# Patient Record
Sex: Male | Born: 1970 | Race: White | Hispanic: No | Marital: Married | Smoking: Former smoker
Health system: Southern US, Community
[De-identification: ages and names within clinical notes are randomized; demographics above are authoritative.]

## PROBLEM LIST (undated history)

## (undated) DIAGNOSIS — Z5189 Encounter for other specified aftercare: Secondary | ICD-10-CM

## (undated) DIAGNOSIS — Z1379 Encounter for other screening for genetic and chromosomal anomalies: Secondary | ICD-10-CM

## (undated) DIAGNOSIS — C189 Malignant neoplasm of colon, unspecified: Secondary | ICD-10-CM

## (undated) HISTORY — DX: Encounter for other screening for genetic and chromosomal anomalies: Z13.79

## (undated) HISTORY — PX: TOOTH EXTRACTION: SUR596

## (undated) HISTORY — DX: Encounter for other specified aftercare: Z51.89

## (undated) HISTORY — DX: Malignant neoplasm of colon, unspecified: C18.9

---

## 1999-11-30 ENCOUNTER — Encounter: Payer: Self-pay | Admitting: Emergency Medicine

## 1999-11-30 ENCOUNTER — Emergency Department (HOSPITAL_COMMUNITY): Admission: EM | Admit: 1999-11-30 | Discharge: 1999-11-30 | Payer: Self-pay | Admitting: Emergency Medicine

## 2000-02-25 ENCOUNTER — Emergency Department (HOSPITAL_COMMUNITY): Admission: EM | Admit: 2000-02-25 | Discharge: 2000-02-25 | Payer: Self-pay

## 2004-04-23 DIAGNOSIS — C189 Malignant neoplasm of colon, unspecified: Secondary | ICD-10-CM

## 2004-04-23 HISTORY — DX: Malignant neoplasm of colon, unspecified: C18.9

## 2005-02-21 HISTORY — PX: COLON SURGERY: SHX602

## 2005-03-02 ENCOUNTER — Ambulatory Visit: Payer: Self-pay | Admitting: General Surgery

## 2005-03-06 ENCOUNTER — Ambulatory Visit: Payer: Self-pay | Admitting: Oncology

## 2005-03-12 ENCOUNTER — Ambulatory Visit: Payer: Self-pay | Admitting: General Surgery

## 2005-03-14 ENCOUNTER — Inpatient Hospital Stay: Payer: Self-pay | Admitting: General Surgery

## 2005-03-23 ENCOUNTER — Ambulatory Visit: Payer: Self-pay | Admitting: Oncology

## 2005-03-28 ENCOUNTER — Ambulatory Visit: Payer: Self-pay | Admitting: General Surgery

## 2005-04-06 ENCOUNTER — Observation Stay: Payer: Self-pay | Admitting: Internal Medicine

## 2005-04-22 ENCOUNTER — Emergency Department: Payer: Self-pay | Admitting: Emergency Medicine

## 2005-04-23 ENCOUNTER — Ambulatory Visit: Payer: Self-pay | Admitting: Oncology

## 2005-04-23 DIAGNOSIS — Z1379 Encounter for other screening for genetic and chromosomal anomalies: Secondary | ICD-10-CM

## 2005-04-23 HISTORY — DX: Encounter for other screening for genetic and chromosomal anomalies: Z13.79

## 2005-05-24 ENCOUNTER — Ambulatory Visit: Payer: Self-pay | Admitting: Oncology

## 2005-06-21 ENCOUNTER — Ambulatory Visit: Payer: Self-pay | Admitting: Oncology

## 2005-07-22 ENCOUNTER — Ambulatory Visit: Payer: Self-pay | Admitting: Oncology

## 2005-08-21 ENCOUNTER — Ambulatory Visit: Payer: Self-pay | Admitting: Oncology

## 2005-09-21 ENCOUNTER — Ambulatory Visit: Payer: Self-pay | Admitting: Oncology

## 2005-10-13 ENCOUNTER — Emergency Department: Payer: Self-pay | Admitting: General Practice

## 2005-11-27 ENCOUNTER — Ambulatory Visit: Payer: Self-pay | Admitting: Oncology

## 2005-12-22 ENCOUNTER — Ambulatory Visit: Payer: Self-pay | Admitting: Oncology

## 2006-03-04 ENCOUNTER — Ambulatory Visit: Payer: Self-pay | Admitting: General Surgery

## 2006-03-07 ENCOUNTER — Ambulatory Visit: Payer: Self-pay | Admitting: Oncology

## 2006-03-23 ENCOUNTER — Ambulatory Visit: Payer: Self-pay | Admitting: Oncology

## 2006-06-05 ENCOUNTER — Ambulatory Visit: Payer: Self-pay | Admitting: Oncology

## 2006-06-22 ENCOUNTER — Ambulatory Visit: Payer: Self-pay | Admitting: Oncology

## 2006-10-02 ENCOUNTER — Ambulatory Visit: Payer: Self-pay | Admitting: Oncology

## 2006-10-22 ENCOUNTER — Ambulatory Visit: Payer: Self-pay | Admitting: Oncology

## 2006-12-23 ENCOUNTER — Ambulatory Visit: Payer: Self-pay | Admitting: Oncology

## 2007-01-16 ENCOUNTER — Ambulatory Visit: Payer: Self-pay | Admitting: Oncology

## 2007-01-22 ENCOUNTER — Ambulatory Visit: Payer: Self-pay | Admitting: Oncology

## 2007-04-24 ENCOUNTER — Ambulatory Visit: Payer: Self-pay | Admitting: Oncology

## 2007-05-01 ENCOUNTER — Ambulatory Visit: Payer: Self-pay | Admitting: Oncology

## 2007-05-25 ENCOUNTER — Ambulatory Visit: Payer: Self-pay | Admitting: Oncology

## 2007-08-22 ENCOUNTER — Ambulatory Visit: Payer: Self-pay | Admitting: Oncology

## 2007-09-18 ENCOUNTER — Ambulatory Visit: Payer: Self-pay | Admitting: Oncology

## 2007-09-22 ENCOUNTER — Ambulatory Visit: Payer: Self-pay | Admitting: Oncology

## 2007-12-23 ENCOUNTER — Ambulatory Visit: Payer: Self-pay | Admitting: Oncology

## 2008-01-15 ENCOUNTER — Ambulatory Visit: Payer: Self-pay | Admitting: Oncology

## 2008-01-22 ENCOUNTER — Ambulatory Visit: Payer: Self-pay | Admitting: Oncology

## 2008-07-22 ENCOUNTER — Ambulatory Visit: Payer: Self-pay | Admitting: Oncology

## 2008-07-29 ENCOUNTER — Ambulatory Visit: Payer: Self-pay | Admitting: Oncology

## 2008-08-21 ENCOUNTER — Ambulatory Visit: Payer: Self-pay | Admitting: Oncology

## 2009-01-21 ENCOUNTER — Ambulatory Visit: Payer: Self-pay | Admitting: Oncology

## 2009-01-27 ENCOUNTER — Ambulatory Visit: Payer: Self-pay | Admitting: Oncology

## 2009-02-21 ENCOUNTER — Ambulatory Visit: Payer: Self-pay | Admitting: Oncology

## 2009-04-05 ENCOUNTER — Ambulatory Visit: Payer: Self-pay | Admitting: General Surgery

## 2009-07-22 ENCOUNTER — Ambulatory Visit: Payer: Self-pay | Admitting: Oncology

## 2009-07-28 ENCOUNTER — Ambulatory Visit: Payer: Self-pay | Admitting: Oncology

## 2009-08-21 ENCOUNTER — Ambulatory Visit: Payer: Self-pay | Admitting: Oncology

## 2010-08-21 ENCOUNTER — Ambulatory Visit: Payer: Self-pay | Admitting: Oncology

## 2010-08-22 ENCOUNTER — Ambulatory Visit: Payer: Self-pay | Admitting: Oncology

## 2010-08-22 LAB — CEA: CEA: 2.2 ng/mL (ref 0.0–4.7)

## 2011-09-13 ENCOUNTER — Ambulatory Visit: Payer: Self-pay | Admitting: Oncology

## 2011-09-13 LAB — COMPREHENSIVE METABOLIC PANEL
Albumin: 4.5 g/dL (ref 3.4–5.0)
Alkaline Phosphatase: 55 U/L (ref 50–136)
Anion Gap: 11 (ref 7–16)
BUN: 12 mg/dL (ref 7–18)
Bilirubin,Total: 1 mg/dL (ref 0.2–1.0)
Calcium, Total: 8.8 mg/dL (ref 8.5–10.1)
Chloride: 102 mmol/L (ref 98–107)
Co2: 28 mmol/L (ref 21–32)
Creatinine: 1.05 mg/dL (ref 0.60–1.30)
EGFR (African American): >60
EGFR (Non-African Amer.): >60
Glucose: 129 mg/dL — ABNORMAL HIGH (ref 65–99)
Osmolality: 283 (ref 275–301)
Potassium: 3.7 mmol/L (ref 3.5–5.1)
SGOT(AST): 22 U/L (ref 15–37)
SGPT (ALT): 31 U/L
Sodium: 141 mmol/L (ref 136–145)
Total Protein: 7.9 g/dL (ref 6.4–8.2)

## 2011-09-13 LAB — CBC CANCER CENTER
Basophil #: 0 x10 3/mm (ref 0.0–0.1)
Basophil %: 0.6 %
Eosinophil #: 0 x10 3/mm (ref 0.0–0.7)
Eosinophil %: 0.4 %
HCT: 42.9 % (ref 40.0–52.0)
HGB: 14.4 g/dL (ref 13.0–18.0)
Lymphocyte #: 1.4 x10 3/mm (ref 1.0–3.6)
Lymphocyte %: 25.7 %
MCH: 30 pg (ref 26.0–34.0)
MCHC: 33.5 g/dL (ref 32.0–36.0)
MCV: 90 fL (ref 80–100)
Monocyte #: 0.4 x10 3/mm (ref 0.2–1.0)
Monocyte %: 8 %
Neutrophil #: 3.6 x10 3/mm (ref 1.4–6.5)
Neutrophil %: 65.3 %
Platelet: 171 x10 3/mm (ref 150–440)
RBC: 4.79 10*6/uL (ref 4.40–5.90)
RDW: 14.1 % (ref 11.5–14.5)
WBC: 5.4 x10 3/mm (ref 3.8–10.6)

## 2011-09-14 LAB — CEA: CEA: 2.6 ng/mL (ref 0.0–4.7)

## 2011-09-22 ENCOUNTER — Ambulatory Visit: Payer: Self-pay | Admitting: Oncology

## 2012-05-28 ENCOUNTER — Ambulatory Visit: Payer: Self-pay | Admitting: General Surgery

## 2012-05-28 HISTORY — PX: COLONOSCOPY: SHX174

## 2012-05-29 LAB — PATHOLOGY REPORT

## 2012-09-21 ENCOUNTER — Ambulatory Visit: Payer: Self-pay | Admitting: Oncology

## 2012-09-29 LAB — COMPREHENSIVE METABOLIC PANEL
Albumin: 4.8 g/dL (ref 3.4–5.0)
Alkaline Phosphatase: 67 U/L (ref 50–136)
Anion Gap: 8 (ref 7–16)
BUN: 9 mg/dL (ref 7–18)
Bilirubin,Total: 0.8 mg/dL (ref 0.2–1.0)
Calcium, Total: 9.1 mg/dL (ref 8.5–10.1)
Chloride: 102 mmol/L (ref 98–107)
Co2: 29 mmol/L (ref 21–32)
Creatinine: 1.19 mg/dL (ref 0.60–1.30)
EGFR (African American): >60
EGFR (Non-African Amer.): >60
Glucose: 105 mg/dL — ABNORMAL HIGH (ref 65–99)
Osmolality: 277 (ref 275–301)
Potassium: 3.4 mmol/L — ABNORMAL LOW (ref 3.5–5.1)
SGOT(AST): 20 U/L (ref 15–37)
SGPT (ALT): 31 U/L (ref 12–78)
Sodium: 139 mmol/L (ref 136–145)
Total Protein: 8.5 g/dL — ABNORMAL HIGH (ref 6.4–8.2)

## 2012-09-29 LAB — CBC CANCER CENTER
Basophil #: 0.1 x10 3/mm (ref 0.0–0.1)
Basophil %: 0.9 %
Eosinophil #: 0 x10 3/mm (ref 0.0–0.7)
Eosinophil %: 0.6 %
HCT: 43.9 % (ref 40.0–52.0)
HGB: 15.5 g/dL (ref 13.0–18.0)
Lymphocyte #: 2.1 x10 3/mm (ref 1.0–3.6)
Lymphocyte %: 32.9 %
MCH: 31.3 pg (ref 26.0–34.0)
MCHC: 35.3 g/dL (ref 32.0–36.0)
MCV: 89 fL (ref 80–100)
Monocyte #: 0.5 x10 3/mm (ref 0.2–1.0)
Monocyte %: 7.8 %
Neutrophil #: 3.7 x10 3/mm (ref 1.4–6.5)
Neutrophil %: 57.8 %
Platelet: 187 x10 3/mm (ref 150–440)
RBC: 4.97 10*6/uL (ref 4.40–5.90)
RDW: 14 % (ref 11.5–14.5)
WBC: 6.3 x10 3/mm (ref 3.8–10.6)

## 2012-09-30 LAB — CEA: CEA: 2.6 ng/mL (ref 0.0–4.7)

## 2012-10-21 ENCOUNTER — Ambulatory Visit: Payer: Self-pay | Admitting: Oncology

## 2012-11-06 ENCOUNTER — Encounter: Payer: Self-pay | Admitting: *Deleted

## 2013-09-28 ENCOUNTER — Ambulatory Visit: Payer: Self-pay | Admitting: Oncology

## 2013-09-28 LAB — CBC CANCER CENTER
Basophil #: 0.1 x10 3/mm (ref 0.0–0.1)
Basophil %: 1 %
Eosinophil #: 0 x10 3/mm (ref 0.0–0.7)
Eosinophil %: 0.3 %
HCT: 44.1 % (ref 40.0–52.0)
HGB: 15.1 g/dL (ref 13.0–18.0)
Lymphocyte #: 1.9 x10 3/mm (ref 1.0–3.6)
Lymphocyte %: 29.8 %
MCH: 30.6 pg (ref 26.0–34.0)
MCHC: 34.2 g/dL (ref 32.0–36.0)
MCV: 89 fL (ref 80–100)
Monocyte #: 0.4 x10 3/mm (ref 0.2–1.0)
Monocyte %: 6.2 %
Neutrophil #: 4.1 x10 3/mm (ref 1.4–6.5)
Neutrophil %: 62.7 %
Platelet: 188 x10 3/mm (ref 150–440)
RBC: 4.93 10*6/uL (ref 4.40–5.90)
RDW: 13.9 % (ref 11.5–14.5)
WBC: 6.5 x10 3/mm (ref 3.8–10.6)

## 2013-09-28 LAB — COMPREHENSIVE METABOLIC PANEL
ALK PHOS: 55 U/L
ANION GAP: 8 (ref 7–16)
Albumin: 4.5 g/dL (ref 3.4–5.0)
BUN: 10 mg/dL (ref 7–18)
Bilirubin,Total: 0.8 mg/dL (ref 0.2–1.0)
CALCIUM: 8.8 mg/dL (ref 8.5–10.1)
Chloride: 102 mmol/L (ref 98–107)
Co2: 29 mmol/L (ref 21–32)
Creatinine: 1.21 mg/dL (ref 0.60–1.30)
EGFR (African American): >60
Glucose: 135 mg/dL — ABNORMAL HIGH (ref 65–99)
Osmolality: 279 (ref 275–301)
POTASSIUM: 3.7 mmol/L (ref 3.5–5.1)
SGOT(AST): 19 U/L (ref 15–37)
SGPT (ALT): 34 U/L (ref 12–78)
Sodium: 139 mmol/L (ref 136–145)
TOTAL PROTEIN: 8 g/dL (ref 6.4–8.2)

## 2013-09-30 LAB — CEA: CEA: 1.9 ng/mL (ref 0.0–4.7)

## 2013-10-21 ENCOUNTER — Ambulatory Visit: Payer: Self-pay | Admitting: Oncology

## 2014-01-26 ENCOUNTER — Emergency Department (HOSPITAL_COMMUNITY): Admission: EM | Admit: 2014-01-26 | Discharge: 2014-01-26 | Discharge status: Against Medical Advice | Payer: Self-pay

## 2014-10-08 ENCOUNTER — Telehealth: Payer: Self-pay | Admitting: Family Medicine

## 2014-10-08 NOTE — Telephone Encounter (Signed)
Pt has not been seen since 02/13/2005.  Pt has been being seen at the [facility] for the past 10 years due to having colon cancer.  Pt is requesting a CPE and lab work.  [employer].  No Rx.  CB#[phone]/[id number]

## 2014-10-11 NOTE — Telephone Encounter (Signed)
No-I am not accepting new patients now.

## 2014-10-11 NOTE — Telephone Encounter (Signed)
Please advise 

## 2014-10-12 NOTE — Telephone Encounter (Signed)
Patient has been advised as below

## 2015-01-05 ENCOUNTER — Encounter: Payer: Self-pay | Admitting: Family Medicine

## 2015-01-05 ENCOUNTER — Ambulatory Visit (INDEPENDENT_AMBULATORY_CARE_PROVIDER_SITE_OTHER): Payer: 59 | Admitting: Family Medicine

## 2015-01-05 ENCOUNTER — Encounter (INDEPENDENT_AMBULATORY_CARE_PROVIDER_SITE_OTHER): Payer: Self-pay

## 2015-01-05 VITALS — BP 118/76 | HR 76 | Temp 97.7°F | Ht 68.5 in | Wt 184.5 lb

## 2015-01-05 DIAGNOSIS — Z0000 Encounter for general adult medical examination without abnormal findings: Secondary | ICD-10-CM | POA: Diagnosis not present

## 2015-01-05 DIAGNOSIS — C189 Malignant neoplasm of colon, unspecified: Secondary | ICD-10-CM

## 2015-01-05 LAB — CBC WITH DIFFERENTIAL/PLATELET
Basophils Absolute: 0 10*3/uL (ref 0.0–0.1)
Basophils Relative: 0.5 % (ref 0.0–3.0)
EOS ABS: 0 10*3/uL (ref 0.0–0.7)
Eosinophils Relative: 0.5 % (ref 0.0–5.0)
HCT: 46.7 % (ref 39.0–52.0)
Hemoglobin: 15.9 g/dL (ref 13.0–17.0)
Lymphocytes Relative: 25.3 % (ref 12.0–46.0)
Lymphs Abs: 1.5 10*3/uL (ref 0.7–4.0)
MCHC: 34 g/dL (ref 30.0–36.0)
MCV: 89.4 fl (ref 78.0–100.0)
MONO ABS: 0.4 10*3/uL (ref 0.1–1.0)
Monocytes Relative: 6.3 % (ref 3.0–12.0)
NEUTROS ABS: 3.9 10*3/uL (ref 1.4–7.7)
NEUTROS PCT: 67.4 % (ref 43.0–77.0)
PLATELETS: 208 10*3/uL (ref 150.0–400.0)
RBC: 5.22 Mil/uL (ref 4.22–5.81)
RDW: 14.5 % (ref 11.5–15.5)
WBC: 5.9 10*3/uL (ref 4.0–10.5)

## 2015-01-05 LAB — COMPREHENSIVE METABOLIC PANEL
ALT: 15 U/L (ref 0–53)
AST: 14 U/L (ref 0–37)
Albumin: 4.8 g/dL (ref 3.5–5.2)
Alkaline Phosphatase: 48 U/L (ref 39–117)
BILIRUBIN TOTAL: 0.9 mg/dL (ref 0.2–1.2)
BUN: 13 mg/dL (ref 6–23)
CO2: 29 meq/L (ref 19–32)
CREATININE: 0.99 mg/dL (ref 0.40–1.50)
Calcium: 9.4 mg/dL (ref 8.4–10.5)
Chloride: 104 mEq/L (ref 96–112)
GFR: 88.16 mL/min (ref >60.00)
GLUCOSE: 90 mg/dL (ref 70–99)
Potassium: 4.2 mEq/L (ref 3.5–5.1)
SODIUM: 141 meq/L (ref 135–145)
Total Protein: 7.6 g/dL (ref 6.0–8.3)

## 2015-01-05 LAB — LIPID PANEL
CHOL/HDL RATIO: 5
Cholesterol: 211 mg/dL — ABNORMAL HIGH (ref 0–200)
HDL: 40.4 mg/dL (ref >39.00)
LDL Cholesterol: 137 mg/dL — ABNORMAL HIGH (ref 0–99)
NONHDL: 171.01
Triglycerides: 168 mg/dL — ABNORMAL HIGH (ref 0.0–149.0)
VLDL: 33.6 mg/dL (ref 0.0–40.0)

## 2015-01-05 LAB — PSA: PSA: 0.37 ng/mL (ref 0.10–4.00)

## 2015-01-05 NOTE — Progress Notes (Signed)
Pre visit review using our clinic review tool, if applicable. No additional management support is needed unless otherwise documented below in the visit note. 

## 2015-01-05 NOTE — Assessment & Plan Note (Signed)
Reviewed preventive care protocols, scheduled due services, and updated immunizations Discussed nutrition, exercise, diet, and healthy lifestyle.  Orders Placed This Encounter  Procedures  . CBC with Differential/Platelet  . Comprehensive metabolic panel  . Lipid panel  . PSA    

## 2015-01-05 NOTE — Progress Notes (Addendum)
Subjective:   Patient ID: Robert Guzman, male    DOB: 16-Nov-1970, 44 y.o.   MRN: [id number]  Robert Guzman is a pleasant 44 y.o. year old male who presents to clinic today with [place]  on 01/05/2015  HPI:  Colon CA- diagnosed at age 6 in 2006.  Was running and had some right lower quadrant pain.  No blood in stool or changes in bowel habits. CT confirmed colon CA.  S/p colectomy and 6 months of chemotherapy.  Was following yearly with Dr. Oliva Bustard but was told he no longer needed to follow up with him.  Will be due for colonoscopy next year (Dr. Fleet Contras).  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  Has no complaints today.  Does not take any medications regularly.  No current outpatient prescriptions on file prior to visit.   No current facility-administered medications on file prior to visit.    No Known Allergies  Past Medical History  Diagnosis Date  . Cancer 2006    COLON  . Colon cancer   . Encounter for blood transfusion     for colon cancer    Past Surgical History  Procedure Laterality Date  . Colon surgery  2013  . Tooth extraction      Family History  Problem Relation Age of Onset  . Cancer Mother   . Diabetes Father   . Hypertension Father     Social History   Social History  . Marital Status: Married    Spouse Name: N/A  . Number of Children: N/A  . Years of Education: N/A   Occupational History  . Not on file.   Social History Main Topics  . Smoking status: Former Research scientist (life sciences)  . Smokeless tobacco: Never Used  . Alcohol Use: Yes  . Drug Use: No  . Sexual Activity: Yes   Other Topics Concern  . 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   Review of Systems  Constitutional: Negative.   HENT: Negative.   Respiratory: Negative.     Cardiovascular: Negative.   Gastrointestinal: Negative.   Endocrine: Negative.   Genitourinary: Negative.   Musculoskeletal: Negative.   Skin: Negative.   Allergic/Immunologic: Negative.   Neurological: Negative.   Hematological: Negative.   Psychiatric/Behavioral: Negative.   All other systems reviewed and are negative.      Objective:    BP 118/76 mmHg  Pulse 76  Temp(Src) 97.7 F (36.5 C) (Oral)  Ht 5' 8.5" (1.74 m)  Wt 184 lb 8 oz (83.689 kg)  BMI 27.64 kg/m2  SpO2 98%   Physical Exam  Constitutional: He is oriented to person, place, and time. He appears well-developed and well-nourished. No distress.  HENT:  Head: Normocephalic.  Eyes: Conjunctivae are normal.  Cardiovascular: Normal rate and regular rhythm.   Pulmonary/Chest: Effort normal and breath sounds normal.  Abdominal: Soft.  Musculoskeletal: Normal range of motion. He exhibits no edema.  Neurological: He is alert and oriented to person, place, and time. No cranial nerve deficit.  Skin: Skin is warm and dry.  Psychiatric: He has a normal mood and affect. His behavior is normal. Judgment and thought content normal.  Nursing note and vitals reviewed.         Assessment &  Plan:   Visit for well man health check - Plan: CBC with Differential/Platelet, Comprehensive metabolic panel, Lipid panel, PSA  Colon cancer No Follow-up on file.

## 2015-01-05 NOTE — Patient Instructions (Signed)
It was really nice to meet you. We will call you with your lab results and you can view them online. 

## 2015-01-06 ENCOUNTER — Encounter: Payer: Self-pay | Admitting: *Deleted

## 2015-04-05 ENCOUNTER — Encounter: Payer: Self-pay | Admitting: *Deleted

## 2015-05-31 ENCOUNTER — Encounter: Payer: Self-pay | Admitting: General Surgery

## 2015-06-01 ENCOUNTER — Encounter: Payer: Self-pay | Admitting: General Surgery

## 2015-06-01 ENCOUNTER — Ambulatory Visit (INDEPENDENT_AMBULATORY_CARE_PROVIDER_SITE_OTHER): Payer: 59 | Admitting: General Surgery

## 2015-06-01 VITALS — BP 160/74 | HR 84 | Resp 12 | Ht 68.0 in | Wt 186.0 lb

## 2015-06-01 DIAGNOSIS — Z85038 Personal history of other malignant neoplasm of large intestine: Secondary | ICD-10-CM

## 2015-06-01 DIAGNOSIS — Z86010 Personal history of colonic polyps: Secondary | ICD-10-CM

## 2015-06-01 MED ORDER — POLYETHYLENE GLYCOL 3350 17 GM/SCOOP PO POWD: ORAL | Status: DC

## 2015-06-01 NOTE — Patient Instructions (Addendum)
Colonoscopy A colonoscopy is an exam to look at the entire large intestine (colon). This exam can help find problems such as tumors, polyps, inflammation, and areas of bleeding. The exam takes about 1 hour.  LET [facility] CARE PROVIDER KNOW ABOUT:   Any allergies you have.  All medicines you are taking, including vitamins, herbs, eye drops, creams, and over-the-counter medicines.  Previous problems you or members of your family have had with the use of anesthetics.  Any blood disorders you have.  Previous surgeries you have had.  Medical conditions you have. RISKS AND COMPLICATIONS  Generally, this is a safe procedure. However, as with any procedure, complications can occur. Possible complications include:  Bleeding.  Tearing or rupture of the colon wall.  Reaction to medicines given during the exam.  Infection (rare). BEFORE THE PROCEDURE   Ask your health care provider about changing or stopping your regular medicines.  You may be prescribed an oral bowel prep. This involves drinking a large amount of medicated liquid, starting the day before your procedure. The liquid will cause you to have multiple loose stools until your stool is almost clear or light green. This cleans out your colon in preparation for the procedure.  Do not eat or drink anything else once you have started the bowel prep, unless your health care provider tells you it is safe to do so.  Arrange for someone to drive you home after the procedure. PROCEDURE   You will be given medicine to help you relax (sedative).  You will lie on your side with your knees bent.  A long, flexible tube with a light and camera on the end (colonoscope) will be inserted through the rectum and into the colon. The camera sends video back to a computer screen as it moves through the colon. The colonoscope also releases carbon dioxide gas to inflate the colon. This helps your health care provider see the area better.  During  the exam, your health care provider may take a small tissue sample (biopsy) to be examined under a microscope if any abnormalities are found.  The exam is finished when the entire colon has been viewed. AFTER THE PROCEDURE   Do not drive for 24 hours after the exam.  You may have a small amount of blood in your stool.  You may pass moderate amounts of gas and have mild abdominal cramping or bloating. This is caused by the gas used to inflate your colon during the exam.  Ask when your test results will be ready and how you will get your results. Make sure you get your test results.   This information is not intended to replace advice given to you by your health care provider. Make sure you discuss any questions you have with your health care provider.   Document Released: 04/06/2000 Document Revised: 01/28/2013 Document Reviewed: 12/15/2012 Elsevier Interactive Patient Education [employer].  Patient has been scheduled for a colonoscopy on 07-13-15 at [facility].

## 2015-06-01 NOTE — Progress Notes (Signed)
Patient ID: Robert Guzman, male   DOB: 10-09-1970, 45 y.o.   MRN: [id number]  Chief Complaint  Patient presents with  . Colonoscopy    HPI Robert Guzman is a 45 y.o. male.  Here today to discuss having a colonoscopy. Last completed 2014. Denies any gastrointestinal issues. Bowels are regular and no bleeding.  I personally reviewed the patient's history. HPI  Past Medical History  Diagnosis Date  . Encounter for blood transfusion     for colon cancer  . Colon cancer ([place]) 2006    COLON/ chemo  . Genetic testing 2007    Negative     Past Surgical History  Procedure Laterality Date  . Tooth extraction    . Colonoscopy  05-28-12  . Colon surgery  Nov 2006    Family History  Problem Relation Age of Onset  . Cancer Mother   . Diabetes Father   . Hypertension Father     Social History Social History  Substance Use Topics  . Smoking status: Former Research scientist (life sciences)  . Smokeless tobacco: Never Used  . Alcohol Use: Yes    No Known Allergies  Current Outpatient Prescriptions  Medication Sig Dispense Refill  . polyethylene glycol powder (GLYCOLAX/MIRALAX) powder 255 grams one bottle for colonoscopy prep 255 g 0   No current facility-administered medications for this visit.    Review of Systems Review of Systems  Constitutional: Negative.   Respiratory: Negative.   Cardiovascular: Negative.   Gastrointestinal: Negative for diarrhea, constipation and blood in stool.    Blood pressure 160/74, pulse 84, resp. rate 12, height 5\' 8"  (1.727 m), weight 186 lb (84.369 kg).  Physical Exam Physical Exam  Constitutional: He is oriented to person, place, and time. He appears well-developed and well-nourished.  HENT:  Mouth/Throat: Oropharynx is clear and moist.  Eyes: Conjunctivae are normal. No scleral icterus.  Neck: Neck supple.  Cardiovascular: Normal rate, regular rhythm and normal heart sounds.   Pulmonary/Chest: Effort normal and breath sounds normal.  Lymphadenopathy:     He has no cervical adenopathy.  Neurological: He is alert and oriented to person, place, and time.  Skin: Skin is warm and dry.  Psychiatric: His behavior is normal.    Data Reviewed Colonoscopy completed 05/28/2012 showed a single 5 mm polyp in the ascending colon. Pathology showed a tubular adenoma without high-grade dysplasia.  Assessment    Candidate for follow-up colonoscopy.    Plan          Colonoscopy with possible biopsy/polypectomy prn: Information regarding the procedure, including its potential risks and complications (including but not limited to perforation of the bowel, which may require emergency surgery to repair, and bleeding) was verbally given to the patient. Educational information regarding lower intestinal endoscopy was given to the patient. Written instructions for how to complete the bowel prep using Miralax were provided. The importance of drinking ample fluids to avoid dehydration as a result of the prep emphasized.  Patient has been scheduled for a colonoscopy on 07-13-15 at [facility].   PCP:  Robert Guzman  This information has been scribed by Robert Guzman.    Robert Guzman 06/02/2015, 7:47 AM

## 2015-06-02 ENCOUNTER — Encounter: Payer: Self-pay | Admitting: General Surgery

## 2015-06-02 DIAGNOSIS — Z86010 Personal history of colonic polyps: Secondary | ICD-10-CM | POA: Insufficient documentation

## 2015-06-02 DIAGNOSIS — Z85038 Personal history of other malignant neoplasm of large intestine: Secondary | ICD-10-CM | POA: Insufficient documentation

## 2015-06-02 NOTE — H&P (Signed)
HPI  Robert Guzman is a 45 y.o. male. Here today to discuss having a colonoscopy. Last completed 2014. Denies any gastrointestinal issues. Bowels are regular and no bleeding.  I personally reviewed the patient's history.  HPI  Past Medical History   Diagnosis  Date   .  Encounter for blood transfusion      for colon cancer   .  Colon cancer ([place])  2006     COLON/ chemo   .  Genetic testing  2007     Negative    Past Surgical History   Procedure  Laterality  Date   .  Tooth extraction     .  Colonoscopy   05-28-12   .  Colon surgery   Nov 2006    Family History   Problem  Relation  Age of Onset   .  Cancer  Mother    .  Diabetes  Father    .  Hypertension  Father     Social History  Social History   Substance Use Topics   .  Smoking status:  Former Research scientist (life sciences)   .  Smokeless tobacco:  Never Used   .  Alcohol Use:  Yes    No Known Allergies  Current Outpatient Prescriptions   Medication  Sig  Dispense  Refill   .  polyethylene glycol powder (GLYCOLAX/MIRALAX) powder  255 grams one bottle for colonoscopy prep  255 g  0    No current facility-administered medications for this visit.    Review of Systems  Review of Systems  Constitutional: Negative.  Respiratory: Negative.  Cardiovascular: Negative.  Gastrointestinal: Negative for diarrhea, constipation and blood in stool.   Blood pressure 160/74, pulse 84, resp. rate 12, height 5\' 8"  (1.727 m), weight 186 lb (84.369 kg).  Physical Exam  Physical Exam  Constitutional: He is oriented to person, place, and time. He appears well-developed and well-nourished.  HENT:  Mouth/Throat: Oropharynx is clear and moist.  Eyes: Conjunctivae are normal. No scleral icterus.  Neck: Neck supple.  Cardiovascular: Normal rate, regular rhythm and normal heart sounds.  Pulmonary/Chest: Effort normal and breath sounds normal.  Lymphadenopathy:  He has no cervical adenopathy.  Neurological: He is alert and oriented to person, place, and  time.  Skin: Skin is warm and dry.  Psychiatric: His behavior is normal.   Data Reviewed  Colonoscopy completed 05/28/2012 showed a single 5 mm polyp in the ascending colon. Pathology showed a tubular adenoma without high-grade dysplasia.  Assessment   Candidate for follow-up colonoscopy.   Plan    Colonoscopy with possible biopsy/polypectomy prn: Information regarding the procedure, including its potential risks and complications (including but not limited to perforation of the bowel, which may require emergency surgery to repair, and bleeding) was verbally given to the patient. Educational information regarding lower intestinal endoscopy was given to the patient. Written instructions for how to complete the bowel prep using Miralax were provided. The importance of drinking ample fluids to avoid dehydration as a result of the prep emphasized.  Patient has been scheduled for a colonoscopy on 07-13-15 at [facility].  PCP: Arnette Norris  This information has been scribed by Karie Fetch [place].  Tashaun Bellow  06/02/2015, 7:47 AM

## 2015-07-06 ENCOUNTER — Telehealth: Payer: Self-pay | Admitting: *Deleted

## 2015-07-06 NOTE — Telephone Encounter (Signed)
Patient was contacted today and he confirms he is not taking any medications at this time. He states that he has picked up Miralax prescription.   We will proceed with colonoscopy as scheduled for 07-13-15 at [facility].  This patient was instructed to call the office should he have further questions.

## 2015-07-12 ENCOUNTER — Encounter: Payer: Self-pay | Admitting: *Deleted

## 2015-07-13 ENCOUNTER — Ambulatory Visit
Admission: RE | Admit: 2015-07-13 | Discharge: 2015-07-13 | Disposition: A | Discharge status: Home / Self Care | Payer: 59 | Source: Ambulatory Visit | Attending: General Surgery | Admitting: General Surgery

## 2015-07-13 ENCOUNTER — Ambulatory Visit: Payer: 59 | Admitting: Registered Nurse

## 2015-07-13 ENCOUNTER — Encounter
Admission: RE | Disposition: A | Discharge status: Home / Self Care | Payer: Self-pay | Source: Ambulatory Visit | Attending: General Surgery

## 2015-07-13 DIAGNOSIS — Z1211 Encounter for screening for malignant neoplasm of colon: Secondary | ICD-10-CM | POA: Insufficient documentation

## 2015-07-13 DIAGNOSIS — Z85038 Personal history of other malignant neoplasm of large intestine: Secondary | ICD-10-CM | POA: Insufficient documentation

## 2015-07-13 DIAGNOSIS — Z86010 Personal history of colonic polyps: Secondary | ICD-10-CM

## 2015-07-13 DIAGNOSIS — Z87891 Personal history of nicotine dependence: Secondary | ICD-10-CM | POA: Diagnosis not present

## 2015-07-13 HISTORY — PX: COLONOSCOPY WITH PROPOFOL: SHX5780

## 2015-07-13 SURGERY — COLONOSCOPY WITH PROPOFOL
Anesthesia: General

## 2015-07-13 MED ORDER — FENTANYL CITRATE (PF) 100 MCG/2ML IJ SOLN
INTRAMUSCULAR | Status: DC | PRN
  Administered 2015-07-13: 50 ug via INTRAVENOUS

## 2015-07-13 MED ORDER — SODIUM CHLORIDE 0.9 % IV SOLN
INTRAVENOUS | Status: DC
  Administered 2015-07-13: 1000 mL via INTRAVENOUS

## 2015-07-13 MED ORDER — PROPOFOL 10 MG/ML IV BOLUS
INTRAVENOUS | Status: DC | PRN
  Administered 2015-07-13: 30 mg via INTRAVENOUS
  Administered 2015-07-13: 50 mg via INTRAVENOUS
  Administered 2015-07-13: 30 mg via INTRAVENOUS

## 2015-07-13 MED ORDER — PROPOFOL 500 MG/50ML IV EMUL
INTRAVENOUS | Status: DC | PRN
  Administered 2015-07-13: 175 ug/kg/min via INTRAVENOUS

## 2015-07-13 MED ORDER — MIDAZOLAM HCL 2 MG/2ML IJ SOLN
INTRAMUSCULAR | Status: DC | PRN
  Administered 2015-07-13: 1 mg via INTRAVENOUS

## 2015-07-13 MED ORDER — LIDOCAINE HCL (CARDIAC) 20 MG/ML IV SOLN
INTRAVENOUS | Status: DC | PRN
  Administered 2015-07-13: 40 mg via INTRAVENOUS

## 2015-07-13 MED ORDER — PHENYLEPHRINE HCL 10 MG/ML IJ SOLN
INTRAMUSCULAR | Status: DC | PRN
  Administered 2015-07-13: 100 ug via INTRAVENOUS

## 2015-07-13 NOTE — Op Note (Signed)
[facility] Gastroenterology Patient Name: Robert Guzman Procedure Date: 07/13/2015 10:32 AM MRN: [id number] Account #: [id number] Date of Birth: 03-14-1971 Admit Type: Outpatient Age: 45 Room: [facility] ENDO ROOM 4 Gender: Male Note Status: Finalized Procedure:            Colonoscopy Indications:          High risk colon cancer surveillance: Personal history                        of colon cancer Providers:            Journey Bellow, MD Referring MD:         Marciano Sequin. Deborra Medina (Referring MD) Medicines:            Monitored Anesthesia Care Complications:        No immediate complications. Procedure:            Pre-Anesthesia Assessment:                       - Prior to the procedure, a History and Physical was                        performed, and patient medications, allergies and                        sensitivities were reviewed. The patient's tolerance of                        previous anesthesia was reviewed.                       - The risks and benefits of the procedure and the                        sedation options and risks were discussed with the                        patient. All questions were answered and informed                        consent was obtained.                       After obtaining informed consent, the colonoscope was                        passed under direct vision. Throughout the procedure,                        the patient's blood pressure, pulse, and oxygen                        saturations were monitored continuously. The                        Colonoscope was introduced through the anus and                        advanced to the the ileocolonic anastomosis. The  colonoscopy was performed without difficulty. The                        patient tolerated the procedure well. The quality of                        the bowel preparation was excellent. Findings:      The entire examined colon appeared normal on  direct and retroflexion       views. Impression:           - The entire examined colon is normal on direct and                        retroflexion views.                       - No specimens collected. Recommendation:       - Repeat colonoscopy in 5 years for surveillance. Procedure Code(s):    --- Professional ---                       [phone], Colonoscopy, flexible; diagnostic, including                        collection of specimen(s) by brushing or washing, when                        performed (separate procedure) Diagnosis Code(s):    --- Professional ---                       [id number], Personal history of other malignant neoplasm                        of large intestine CPT copyright 2016 American Medical Association. All rights reserved. The codes documented in this report are preliminary and upon coder review may  be revised to meet current compliance requirements. Macari Bellow, MD 07/13/2015 10:54:28 AM This report has been signed electronically. Number of Addenda: 0 Note Initiated On: 07/13/2015 10:32 AM Scope Withdrawal Time: 0 hours 7 minutes 12 seconds  Total Procedure Duration: 0 hours 10 minutes 23 seconds       [facility]

## 2015-07-13 NOTE — Transfer of Care (Signed)
Immediate Anesthesia Transfer of Care Note  Patient: Robert Guzman  Procedure(s) Performed: Procedure(s): COLONOSCOPY WITH PROPOFOL (N/A)  Patient Location: PACU and Endoscopy Unit  Anesthesia Type:General  Level of Consciousness: sedated  Airway & Oxygen Therapy: Patient Spontanous Breathing and Patient connected to nasal cannula oxygen  Post-op Assessment: Report given to RN and Post -op Vital signs reviewed and stable  Post vital signs: Reviewed and stable  Last Vitals:  Filed Vitals:   07/13/15 1057 07/13/15 1100  BP: 78/44 93/58  Pulse: 67 76  Temp:  36.1 C  Resp: 13 13    Complications: No apparent anesthesia complications

## 2015-07-13 NOTE — Anesthesia Procedure Notes (Signed)
Date/Time: 07/13/2015 10:32 AM Performed by: Doreen Salvage Pre-anesthesia Checklist: Patient identified, Emergency Drugs available, Suction available and Patient being monitored Patient Re-evaluated:Patient Re-evaluated prior to inductionOxygen Delivery Method: Nasal cannula Intubation Type: IV induction Dental Injury: Teeth and Oropharynx as per pre-operative assessment  Comments: Nasal cannula with etCO2 monitoring

## 2015-07-13 NOTE — Anesthesia Postprocedure Evaluation (Signed)
Anesthesia Post Note  Patient: Robert Guzman  Procedure(s) Performed: Procedure(s) (LRB): COLONOSCOPY WITH PROPOFOL (N/A)  Patient location during evaluation: Endoscopy Anesthesia Type: General Level of consciousness: awake and alert Pain management: pain level controlled Vital Signs Assessment: post-procedure vital signs reviewed and stable Respiratory status: spontaneous breathing, nonlabored ventilation, respiratory function stable and patient connected to nasal cannula oxygen Cardiovascular status: blood pressure returned to baseline and stable Postop Assessment: no signs of nausea or vomiting Anesthetic complications: no    Last Vitals:  Filed Vitals:   07/13/15 1120 07/13/15 1130  BP: 98/71 93/60  Pulse: 65 65  Temp:    Resp: 19 16    Last Pain: There were no vitals filed for this visit.               Martha Clan

## 2015-07-13 NOTE — H&P (Signed)
LUISA DASARI [id number] 02/19/1971     HPI:  Previous colon cancer. For follow up exam.   Prescriptions prior to admission  Medication Sig Dispense Refill Last Dose  . polyethylene glycol powder (GLYCOLAX/MIRALAX) powder 255 grams one bottle for colonoscopy prep 255 g 0    No Known Allergies Past Medical History  Diagnosis Date  . Encounter for blood transfusion     for colon cancer  . Genetic testing 2007    Negative   . Colon cancer ([place]) 2006    COLON/ chemo   Past Surgical History  Procedure Laterality Date  . Tooth extraction    . Colonoscopy  05-28-12  . Colon surgery  Nov 2006   Social History   Social History  . Marital Status: Married    Spouse Name: N/A  . Number of Children: N/A  . Years of Education: N/A   Occupational History  . Not on file.   Social History Main Topics  . Smoking status: Former Research scientist (life sciences)  . Smokeless tobacco: Never Used  . Alcohol Use: No  . Drug Use: No  . Sexual Activity: Yes   Other Topics Concern  . 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     PE: HEENT: Negative. Lungs: Clear. Cardio: RR. Hadriel Bellow 43/22/2015   Assessment/Plan:  Proceed with planned endoscopy.

## 2015-07-13 NOTE — Anesthesia Preprocedure Evaluation (Signed)
Anesthesia Evaluation  Patient identified by MRN, date of birth, ID band Patient awake    Reviewed: Allergy & Precautions, H&P , NPO status , Patient's Chart, lab work & pertinent test results, reviewed documented beta blocker date and time   History of Anesthesia Complications Negative for: history of anesthetic complications  Airway Mallampati: II  TM Distance: >3 FB Neck ROM: full    Dental no notable dental hx. (+) Caps, Missing, Teeth Intact   Pulmonary neg pulmonary ROS,    Pulmonary exam normal breath sounds clear to auscultation       Cardiovascular Exercise Tolerance: Good negative cardio ROS Normal cardiovascular exam Rhythm:regular Rate:Normal     Neuro/Psych negative neurological ROS  negative psych ROS   GI/Hepatic Neg liver ROS, History of colon cancer s/p resection and chemo   Endo/Other  negative endocrine ROS  Renal/GU negative Renal ROS  negative genitourinary   Musculoskeletal   Abdominal   Peds  Hematology negative hematology ROS (+)   Anesthesia Other Findings Past Medical History:   Encounter for blood transfusion                                Comment:for colon cancer   Genetic testing                                 2007           Comment:Negative    Colon cancer ([place])                              2006           Comment:COLON/ chemo   Reproductive/Obstetrics negative OB ROS                             Anesthesia Physical Anesthesia Plan  ASA: II  Anesthesia Plan: General   Post-op Pain Management:    Induction:   Airway Management Planned:   Additional Equipment:   Intra-op Plan:   Post-operative Plan:   Informed Consent: I have reviewed the patients History and Physical, chart, labs and discussed the procedure including the risks, benefits and alternatives for the proposed anesthesia with the patient or authorized representative who has  indicated his/her understanding and acceptance.   Dental Advisory Given  Plan Discussed with: Anesthesiologist, CRNA and Surgeon  Anesthesia Plan Comments:         Anesthesia Quick Evaluation

## 2015-12-27 ENCOUNTER — Other Ambulatory Visit: Payer: Self-pay | Admitting: Family Medicine

## 2015-12-27 DIAGNOSIS — Z0000 Encounter for general adult medical examination without abnormal findings: Secondary | ICD-10-CM

## 2016-01-02 ENCOUNTER — Other Ambulatory Visit (INDEPENDENT_AMBULATORY_CARE_PROVIDER_SITE_OTHER): Payer: 59

## 2016-01-02 DIAGNOSIS — Z0000 Encounter for general adult medical examination without abnormal findings: Secondary | ICD-10-CM | POA: Diagnosis not present

## 2016-01-02 LAB — CBC WITH DIFFERENTIAL/PLATELET
Basophils Absolute: 0 10*3/uL (ref 0.0–0.1)
Basophils Relative: 0.4 % (ref 0.0–3.0)
EOS PCT: 3 % (ref 0.0–5.0)
Eosinophils Absolute: 0.2 10*3/uL (ref 0.0–0.7)
HCT: 44.4 % (ref 39.0–52.0)
Hemoglobin: 15.4 g/dL (ref 13.0–17.0)
LYMPHS ABS: 1.5 10*3/uL (ref 0.7–4.0)
Lymphocytes Relative: 24.2 % (ref 12.0–46.0)
MCHC: 34.7 g/dL (ref 30.0–36.0)
MCV: 87.7 fl (ref 78.0–100.0)
MONO ABS: 0.6 10*3/uL (ref 0.1–1.0)
MONOS PCT: 9.7 % (ref 3.0–12.0)
NEUTROS ABS: 4 10*3/uL (ref 1.4–7.7)
NEUTROS PCT: 62.7 % (ref 43.0–77.0)
PLATELETS: 188 10*3/uL (ref 150.0–400.0)
RBC: 5.06 Mil/uL (ref 4.22–5.81)
RDW: 15.1 % (ref 11.5–15.5)
WBC: 6.4 10*3/uL (ref 4.0–10.5)

## 2016-01-02 LAB — LIPID PANEL
CHOL/HDL RATIO: 6
Cholesterol: 211 mg/dL — ABNORMAL HIGH (ref 0–200)
HDL: 33.7 mg/dL — AB (ref >39.00)
LDL CALC: 143 mg/dL — AB (ref 0–99)
NONHDL: 177.13
Triglycerides: 172 mg/dL — ABNORMAL HIGH (ref 0.0–149.0)
VLDL: 34.4 mg/dL (ref 0.0–40.0)

## 2016-01-02 LAB — COMPREHENSIVE METABOLIC PANEL
ALT: 31 U/L (ref 0–53)
AST: 20 U/L (ref 0–37)
Albumin: 4.4 g/dL (ref 3.5–5.2)
Alkaline Phosphatase: 48 U/L (ref 39–117)
BUN: 10 mg/dL (ref 6–23)
CHLORIDE: 103 meq/L (ref 96–112)
CO2: 29 meq/L (ref 19–32)
Calcium: 8.8 mg/dL (ref 8.4–10.5)
Creatinine, Ser: 1.13 mg/dL (ref 0.40–1.50)
GFR: 75.32 mL/min (ref >60.00)
GLUCOSE: 138 mg/dL — AB (ref 70–99)
POTASSIUM: 3.9 meq/L (ref 3.5–5.1)
Sodium: 140 mEq/L (ref 135–145)
Total Bilirubin: 0.9 mg/dL (ref 0.2–1.2)
Total Protein: 7.1 g/dL (ref 6.0–8.3)

## 2016-01-02 LAB — PSA: PSA: 0.47 ng/mL (ref 0.10–4.00)

## 2016-01-09 ENCOUNTER — Encounter: Payer: Self-pay | Admitting: Family Medicine

## 2016-01-09 ENCOUNTER — Ambulatory Visit (INDEPENDENT_AMBULATORY_CARE_PROVIDER_SITE_OTHER): Payer: 59 | Admitting: Family Medicine

## 2016-01-09 VITALS — BP 142/82 | HR 75 | Temp 98.3°F | Ht 69.0 in | Wt 189.2 lb

## 2016-01-09 DIAGNOSIS — Z85038 Personal history of other malignant neoplasm of large intestine: Secondary | ICD-10-CM | POA: Diagnosis not present

## 2016-01-09 DIAGNOSIS — Z0000 Encounter for general adult medical examination without abnormal findings: Secondary | ICD-10-CM

## 2016-01-09 DIAGNOSIS — E785 Hyperlipidemia, unspecified: Secondary | ICD-10-CM | POA: Insufficient documentation

## 2016-01-09 NOTE — Assessment & Plan Note (Signed)
Reviewed preventive care protocols, scheduled due services, and updated immunizations Discussed nutrition, exercise, diet, and healthy lifestyle.  

## 2016-01-09 NOTE — Assessment & Plan Note (Signed)
Clean colonoscopy this year.

## 2016-01-09 NOTE — Progress Notes (Signed)
Pre visit review using our clinic review tool, if applicable. No additional management support is needed unless otherwise documented below in the visit note. 

## 2016-01-09 NOTE — Progress Notes (Signed)
Subjective:   Patient ID: Robert Guzman, male    DOB: [birth year], 45 y.o.   MRN: [id number]  Robert Guzman is a pleasant 45 y.o. year old male who presents to clinic today with Annual Exam  on 01/09/2016  HPI:  Colon CA- diagnosed at age 63 in 2006.  Was running and had some right lower quadrant pain.  No blood in stool or changes in bowel habits. CT confirmed colon CA.  S/p colectomy and 6 months of chemotherapy.  Was following yearly with Dr. Oliva Bustard but was told he no longer needed to follow up with him.  Colonoscopy 3/17(Dr. Fleet Contras)- 5 year recall.  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  Has no complaints today.  Does not take any medications regularly.  Lab Results  Component Value Date   CHOL 211 (H) 01/02/2016   HDL 33.70 (L) 01/02/2016   LDLCALC 143 (H) 01/02/2016   TRIG 172.0 (H) 01/02/2016   CHOLHDL 6 01/02/2016   Lab Results  Component Value Date   CREATININE 1.13 01/02/2016   Lab Results  Component Value Date   NA 140 01/02/2016   K 3.9 01/02/2016   CL 103 01/02/2016   CO2 29 01/02/2016   Lab Results  Component Value Date   CREATININE 1.13 01/02/2016   Lab Results  Component Value Date   WBC 6.4 01/02/2016   HGB 15.4 01/02/2016   HCT 44.4 01/02/2016   MCV 87.7 01/02/2016   PLT 188.0 01/02/2016     No current outpatient prescriptions on file prior to visit.   No current facility-administered medications on file prior to visit.     No Known Allergies  Past Medical History:  Diagnosis Date  . Colon cancer ([place]) 2006   COLON/ chemo  . Encounter for blood transfusion    for colon cancer  . Genetic testing 2007   Negative     Past Surgical History:  Procedure Laterality Date  . COLON SURGERY  Nov 2006  . COLONOSCOPY  05-28-12  . COLONOSCOPY WITH PROPOFOL N/A 07/13/2015   Procedure: COLONOSCOPY WITH PROPOFOL;  Surgeon: Wasim Bellow, MD;  Location: [facility] ENDOSCOPY;  Service: Endoscopy;  Laterality: N/A;  . TOOTH  EXTRACTION      Family History  Problem Relation Age of Onset  . Cancer Mother   . Diabetes Father   . Hypertension Father     Social History   Social History  . Marital status: Married    Spouse name: N/A  . Number of children: N/A  . Years of education: N/A   Occupational History  . Not on file.   Social History Main Topics  . Smoking status: Former Research scientist (life sciences)  . Smokeless tobacco: Never Used  . Alcohol use No  . Drug use: No  . Sexual activity: Yes   Other Topics Concern  . 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   Review of Systems  Constitutional: Negative.   HENT: Negative.   Respiratory: Negative.   Cardiovascular: Negative.   Gastrointestinal: Negative.   Endocrine: Negative.   Genitourinary: Negative.   Musculoskeletal: Negative.   Skin: Negative.   Allergic/Immunologic: Negative.   Neurological: Negative.   Hematological: Negative.   Psychiatric/Behavioral: Negative.   All other systems reviewed  and are negative.      Objective:    BP (!) 142/82   Pulse 75   Temp 98.3 F (36.8 C) (Oral)   Ht 5\' 9"  (1.753 m)   Wt 189 lb 4 oz (85.8 kg)   SpO2 97%   BMI 27.95 kg/m    Physical Exam  Constitutional: He is oriented to person, place, and time. He appears well-developed and well-nourished. No distress.  HENT:  Head: Normocephalic.  Eyes: Conjunctivae are normal.  Cardiovascular: Normal rate and regular rhythm.   Pulmonary/Chest: Effort normal and breath sounds normal.  Abdominal: Soft.  Musculoskeletal: Normal range of motion. He exhibits no edema.  Neurological: He is alert and oriented to person, place, and time. No cranial nerve deficit.  Skin: Skin is warm and dry.  Psychiatric: He has a normal mood and affect. His behavior is normal. Judgment and  thought content normal.  Nursing note and vitals reviewed.         Assessment & Plan:   Visit for well man health check  HLD (hyperlipidemia)  History of colon cancer No Follow-up on file.

## 2016-01-09 NOTE — Assessment & Plan Note (Signed)
New- given handout on low cholesterol diet.

## 2016-01-09 NOTE — Patient Instructions (Signed)
Great to see you. Please work on your cholesterol.  Have a happy birthday!

## 2016-12-28 ENCOUNTER — Other Ambulatory Visit: Payer: Self-pay | Admitting: Family Medicine

## 2016-12-28 DIAGNOSIS — Z0000 Encounter for general adult medical examination without abnormal findings: Secondary | ICD-10-CM

## 2017-01-04 ENCOUNTER — Other Ambulatory Visit (INDEPENDENT_AMBULATORY_CARE_PROVIDER_SITE_OTHER): Payer: 59

## 2017-01-04 DIAGNOSIS — Z0000 Encounter for general adult medical examination without abnormal findings: Secondary | ICD-10-CM | POA: Diagnosis not present

## 2017-01-04 LAB — CBC WITH DIFFERENTIAL/PLATELET
Basophils Absolute: 0 10*3/uL (ref 0.0–0.1)
Basophils Relative: 0.5 % (ref 0.0–3.0)
EOS PCT: 2 % (ref 0.0–5.0)
Eosinophils Absolute: 0.1 10*3/uL (ref 0.0–0.7)
HCT: 45.9 % (ref 39.0–52.0)
Hemoglobin: 15.5 g/dL (ref 13.0–17.0)
LYMPHS ABS: 2.2 10*3/uL (ref 0.7–4.0)
Lymphocytes Relative: 40.7 % (ref 12.0–46.0)
MCHC: 33.8 g/dL (ref 30.0–36.0)
MCV: 91.9 fl (ref 78.0–100.0)
MONOS PCT: 10.6 % (ref 3.0–12.0)
Monocytes Absolute: 0.6 10*3/uL (ref 0.1–1.0)
NEUTROS ABS: 2.5 10*3/uL (ref 1.4–7.7)
NEUTROS PCT: 46.2 % (ref 43.0–77.0)
PLATELETS: 199 10*3/uL (ref 150.0–400.0)
RBC: 5 Mil/uL (ref 4.22–5.81)
RDW: 14.6 % (ref 11.5–15.5)
WBC: 5.4 10*3/uL (ref 4.0–10.5)

## 2017-01-04 LAB — COMPREHENSIVE METABOLIC PANEL
ALK PHOS: 41 U/L (ref 39–117)
ALT: 13 U/L (ref 0–53)
AST: 13 U/L (ref 0–37)
Albumin: 4.5 g/dL (ref 3.5–5.2)
BUN: 17 mg/dL (ref 6–23)
CHLORIDE: 104 meq/L (ref 96–112)
CO2: 29 meq/L (ref 19–32)
Calcium: 9.4 mg/dL (ref 8.4–10.5)
Creatinine, Ser: 1.18 mg/dL (ref 0.40–1.50)
GFR: 71.32 mL/min (ref >60.00)
GLUCOSE: 98 mg/dL (ref 70–99)
POTASSIUM: 4 meq/L (ref 3.5–5.1)
Sodium: 141 mEq/L (ref 135–145)
Total Bilirubin: 1.1 mg/dL (ref 0.2–1.2)
Total Protein: 7.1 g/dL (ref 6.0–8.3)

## 2017-01-04 LAB — PSA: PSA: 0.57 ng/mL (ref 0.10–4.00)

## 2017-01-04 LAB — LIPID PANEL
Cholesterol: 191 mg/dL (ref 0–200)
HDL: 49.9 mg/dL (ref >39.00)
LDL Cholesterol: 111 mg/dL — ABNORMAL HIGH (ref 0–99)
NONHDL: 141.43
Total CHOL/HDL Ratio: 4
Triglycerides: 151 mg/dL — ABNORMAL HIGH (ref 0.0–149.0)
VLDL: 30.2 mg/dL (ref 0.0–40.0)

## 2017-01-09 ENCOUNTER — Ambulatory Visit (INDEPENDENT_AMBULATORY_CARE_PROVIDER_SITE_OTHER): Payer: 59 | Admitting: Family Medicine

## 2017-01-09 ENCOUNTER — Encounter: Payer: Self-pay | Admitting: Family Medicine

## 2017-01-09 VITALS — BP 120/76 | HR 75 | Temp 97.4°F | Ht 69.0 in | Wt 177.8 lb

## 2017-01-09 DIAGNOSIS — E781 Pure hyperglyceridemia: Secondary | ICD-10-CM

## 2017-01-09 DIAGNOSIS — Z23 Encounter for immunization: Secondary | ICD-10-CM | POA: Diagnosis not present

## 2017-01-09 DIAGNOSIS — Z85038 Personal history of other malignant neoplasm of large intestine: Secondary | ICD-10-CM | POA: Diagnosis not present

## 2017-01-09 DIAGNOSIS — Z0000 Encounter for general adult medical examination without abnormal findings: Secondary | ICD-10-CM

## 2017-01-09 DIAGNOSIS — Z86010 Personal history of colonic polyps: Secondary | ICD-10-CM

## 2017-01-09 NOTE — Assessment & Plan Note (Signed)
Diet controlled.  

## 2017-01-09 NOTE — Assessment & Plan Note (Signed)
Colonoscopy UTD. 

## 2017-01-09 NOTE — Addendum Note (Signed)
Addended by: Benson Setting L on: 01/09/2017 09:02 AM   Modules accepted: Orders

## 2017-01-09 NOTE — Progress Notes (Addendum)
Subjective:   Patient ID: Robert Guzman, male    DOB: 1970-11-24, 46 y.o.   MRN: [id number]  Robert Guzman is a pleasant 46 y.o. year old male who presents to clinic today with Annual Exam (Pt states he is doing well and would like the flu shot today)  on 01/09/2017  HPI:  Colon CA- diagnosed at age 40 in 2006.  Was running and had some right lower quadrant pain.  No blood in stool or changes in bowel habits. CT confirmed colon CA.  S/p colectomy and 6 months of chemotherapy.  Was following yearly with Dr. Oliva Bustard but was told he no longer needed to follow up with him.  Colonoscopy 3/17(Dr. Fleet Contras)- 5 year recall.  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 Wt Readings from Last 3 Encounters:  01/09/17 177 lb 12.8 oz (80.6 kg)  01/09/16 189 lb 4 oz (85.8 kg)  07/13/15 180 lb (81.6 kg)     Denies any CP or SOB.  Has no complaints today.  Does not take any medications regularly.  Lab Results  Component Value Date   CHOL 191 01/04/2017   HDL 49.90 01/04/2017   LDLCALC 111 (H) 01/04/2017   TRIG 151.0 (H) 01/04/2017   CHOLHDL 4 01/04/2017   Lab Results  Component Value Date   CREATININE 1.18 01/04/2017   Lab Results  Component Value Date   NA 141 01/04/2017   K 4.0 01/04/2017   CL 104 01/04/2017   CO2 29 01/04/2017   Lab Results  Component Value Date   CREATININE 1.18 01/04/2017   Lab Results  Component Value Date   WBC 5.4 01/04/2017   HGB 15.5 01/04/2017   HCT 45.9 01/04/2017   MCV 91.9 01/04/2017   PLT 199.0 01/04/2017     No current outpatient prescriptions on file prior to visit.   No current facility-administered medications on file prior to visit.     No Known Allergies  Past Medical History:  Diagnosis Date  . Colon cancer ([place]) 2006   COLON/ chemo  . Encounter for blood transfusion    for colon cancer  . Genetic testing 2007   Negative     Past Surgical History:  Procedure Laterality Date    . COLON SURGERY  Nov 2006  . COLONOSCOPY  05-28-12  . COLONOSCOPY WITH PROPOFOL N/A 07/13/2015   Procedure: COLONOSCOPY WITH PROPOFOL;  Surgeon: Brevon Bellow, MD;  Location[facility] ENDOSCOPY;  Service: Endoscopy;  Laterality: N/A;  . TOOTH EXTRACTION      Family History  Problem Relation Age of Onset  . Cancer Mother   . Diabetes Father   . Hypertension Father     Social History   Social History  . Marital status: Married    Spouse name: N/A  . Number of children: N/A  . Years of education: N/A   Occupational History  . Not on file.   Social History Main Topics  . Smoking status: Former Research scientist (life sciences)  . Smokeless tobacco: Never Used  . Alcohol use No  . Drug use: No  . Sexual activity: Yes   Other Topics Concern  . 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   Review of Systems  Constitutional: Negative.  HENT: Negative.   Respiratory: Negative.   Cardiovascular: Negative.   Gastrointestinal: Negative.   Endocrine: Negative.   Genitourinary: Negative.   Musculoskeletal: Negative.   Skin: Negative.   Allergic/Immunologic: Negative.   Neurological: Negative.   Hematological: Negative.   Psychiatric/Behavioral: Negative.   All other systems reviewed and are negative.      Objective:    BP 120/76 (BP Location: Left Arm, Patient Position: Sitting, Cuff Size: Normal)   Pulse 75   Temp (!) 97.4 F (36.3 C) (Oral)   Ht 5\' 9"  (1.753 m)   Wt 177 lb 12.8 oz (80.6 kg)   SpO2 98%   BMI 26.26 kg/m    Physical Exam  Constitutional: He is oriented to person, place, and time. He appears well-developed and well-nourished. No distress.  HENT:  Head: Normocephalic.  Eyes: Conjunctivae are normal.  Neck: Neck supple.  Cardiovascular: Normal rate and regular rhythm.    Pulmonary/Chest: Effort normal and breath sounds normal.  Abdominal: Soft.  Musculoskeletal: Normal range of motion. He exhibits no edema.  Neurological: He is alert and oriented to person, place, and time. No cranial nerve deficit.  Skin: Skin is warm and dry.  Psychiatric: He has a normal mood and affect. His behavior is normal. Judgment and thought content normal.  Nursing note and vitals reviewed.         Assessment & Plan:   Visit for well man health check  Pure hyperglyceridemia  History of colon cancer  History of colonic polyps No Follow-up on file.

## 2017-01-09 NOTE — Assessment & Plan Note (Signed)
Reviewed preventive care protocols, scheduled due services, and updated immunizations Discussed nutrition, exercise, diet, and healthy lifestyle.  

## 2018-01-09 ENCOUNTER — Encounter: Payer: Self-pay | Admitting: Family Medicine

## 2018-11-27 ENCOUNTER — Encounter: Payer: Self-pay | Admitting: General Surgery

## 2018-12-24 ENCOUNTER — Other Ambulatory Visit: Payer: Self-pay

## 2018-12-24 ENCOUNTER — Emergency Department: Payer: 59

## 2018-12-24 ENCOUNTER — Emergency Department
Admission: EM | Admit: 2018-12-24 | Discharge: 2018-12-25 | Disposition: A | Discharge status: Home / Self Care | Payer: 59 | Attending: Emergency Medicine | Admitting: Emergency Medicine

## 2018-12-24 DIAGNOSIS — R109 Unspecified abdominal pain: Secondary | ICD-10-CM | POA: Diagnosis present

## 2018-12-24 DIAGNOSIS — Z85038 Personal history of other malignant neoplasm of large intestine: Secondary | ICD-10-CM | POA: Insufficient documentation

## 2018-12-24 DIAGNOSIS — M5124 Other intervertebral disc displacement, thoracic region: Secondary | ICD-10-CM | POA: Diagnosis not present

## 2018-12-24 DIAGNOSIS — Z87891 Personal history of nicotine dependence: Secondary | ICD-10-CM | POA: Diagnosis not present

## 2018-12-24 LAB — BASIC METABOLIC PANEL
Anion gap: 10 (ref 5–15)
BUN: 16 mg/dL (ref 6–20)
CO2: 27 mmol/L (ref 22–32)
Calcium: 9.6 mg/dL (ref 8.9–10.3)
Chloride: 102 mmol/L (ref 98–111)
Creatinine, Ser: 1.24 mg/dL (ref 0.61–1.24)
GFR calc Af Amer: >60 mL/min (ref >60)
GFR calc non Af Amer: >60 mL/min (ref >60)
Glucose, Bld: 106 mg/dL — ABNORMAL HIGH (ref 70–99)
Potassium: 3.6 mmol/L (ref 3.5–5.1)
Sodium: 139 mmol/L (ref 135–145)

## 2018-12-24 LAB — CBC
HCT: 46.6 % (ref 39.0–52.0)
Hemoglobin: 16.3 g/dL (ref 13.0–17.0)
MCH: 30.1 pg (ref 26.0–34.0)
MCHC: 35 g/dL (ref 30.0–36.0)
MCV: 86 fL (ref 80.0–100.0)
Platelets: 194 10*3/uL (ref 150–400)
RBC: 5.42 MIL/uL (ref 4.22–5.81)
RDW: 13.5 % (ref 11.5–15.5)
WBC: 11 10*3/uL — ABNORMAL HIGH (ref 4.0–10.5)
nRBC: 0 % (ref 0.0–0.2)

## 2018-12-24 LAB — URINALYSIS, ROUTINE W REFLEX MICROSCOPIC
Bilirubin Urine: NEGATIVE
Glucose, UA: NEGATIVE mg/dL
Hgb urine dipstick: NEGATIVE
Ketones, ur: NEGATIVE mg/dL
Leukocytes,Ua: NEGATIVE
Nitrite: NEGATIVE
Protein, ur: NEGATIVE mg/dL
Specific Gravity, Urine: 1.008 (ref 1.005–1.030)
pH: 5 (ref 5.0–8.0)

## 2018-12-24 MED ORDER — KETOROLAC TROMETHAMINE 30 MG/ML IJ SOLN
15.0000 mg | Freq: Once | INTRAMUSCULAR | Status: AC
  Administered 2018-12-24: 15 mg via INTRAVENOUS
  Filled 2018-12-24: qty 1

## 2018-12-24 NOTE — ED Provider Notes (Signed)
[facility] Emergency Department Provider Note  ____________________________________________  Time seen: Approximately 11:43 PM  I have reviewed the triage vital signs and the nursing notes.   HISTORY  Chief Complaint Back Pain   HPI Robert Guzman is a 48 y.o. male with h/o remote colon cancer 14 years ago s/p resection and chemo who presents for evaluation of R flank pain. Patient complaining of dull, constant pain since last night. No trauma, no rash, no N/V, no abdominal pain, no fever, no dysuria or hematuria, no CP or SOB, no weight loss, no IVDU, no saddle anesthesia, no lower extremity weakness or numbness. No prior h/o kidney stone. Took ibuprofen this evening which improved the pain. Pain is currently 2/10 and non radiating.  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  Past Medical History:  Diagnosis Date   Colon cancer ([place]) 2006   COLON/ chemo   Encounter for blood transfusion    for colon cancer   Genetic testing 2007   Negative     Patient Active Problem List   Diagnosis Date Noted   HLD (hyperlipidemia) 01/09/2016   History of colon cancer 06/02/2015   History of colonic polyps 06/02/2015   Visit for well man health check 01/05/2015    Past Surgical History:  Procedure Laterality Date   COLON SURGERY  Nov 2006   COLONOSCOPY  05-28-12   COLONOSCOPY WITH PROPOFOL N/A 07/13/2015   Procedure: COLONOSCOPY WITH PROPOFOL;  Surgeon: Klayten Bellow, MD;  Location: [facility] ENDOSCOPY;  Service: Endoscopy;  Laterality: N/A;   TOOTH EXTRACTION      Prior to Admission medications   Not on File    Allergies Patient has no known allergies.  Family History  Problem Relation Age of Onset   Cancer Mother    Diabetes Father    Hypertension Father     Social History Social History   Tobacco Use   Smoking status: Former Smoker   Smokeless tobacco: Never Used  Substance Use Topics    Alcohol use: No   Drug use: No    Review of Systems  Constitutional: Negative for fever. Eyes: Negative for visual changes. ENT: Negative for sore throat. Neck: No neck pain  Cardiovascular: Negative for chest pain. Respiratory: Negative for shortness of breath. Gastrointestinal: Negative for abdominal pain, vomiting or diarrhea. Genitourinary: Negative for dysuria. + R flank pain Musculoskeletal: Negative for back pain. Skin: Negative for rash. Neurological: Negative for headaches, weakness or numbness. Psych: No SI or HI  ____________________________________________   PHYSICAL EXAM:  VITAL SIGNS: ED Triage Vitals [12/24/18 2218]  Enc Vitals Group     BP (!) 123/107     Pulse Rate 85     Resp 20     Temp 98.3 F (36.8 C)     Temp Source Oral     SpO2 99 %     Weight 180 lb (81.6 kg)     Height _0  (1.727 m)     Head Circumference      Peak Flow      Pain Score 6     Pain Loc      Pain Edu?      Excl. in [place]?     Constitutional: Alert and oriented. Well appearing and in no apparent distress. HEENT:      Head: Normocephalic and atraumatic.         Eyes: Conjunctivae are normal. Sclera is non-icteric.  Mouth/Throat: Mucous membranes are moist.       Neck: Supple with no signs of meningismus. Cardiovascular: Regular rate and rhythm. No murmurs, gallops, or rubs. 2+ symmetrical distal pulses are present in all extremities. No JVD. Respiratory: Normal respiratory effort. Lungs are clear to auscultation bilaterally. No wheezes, crackles, or rhonchi.  Gastrointestinal: Soft, non tender, and non distended with positive bowel sounds. No rebound or guarding. Genitourinary: No CVA tenderness. Musculoskeletal: Nontender with normal range of motion in all extremities. No edema, cyanosis, or erythema of extremities. No midline C/T/L spine tenderness Neurologic: Normal speech and language. Face is symmetric. Moving all extremities. No gross focal neurologic deficits are  appreciated. Skin: Skin is warm, dry and intact. No rash noted. Psychiatric: Mood and affect are normal. Speech and behavior are normal.  ____________________________________________   LABS (all labs ordered are listed, but only abnormal results are displayed)  Labs Reviewed  BASIC METABOLIC PANEL - Abnormal; Notable for the following components:      Result Value   Glucose, Bld 106 (*)    All other components within normal limits  CBC - Abnormal; Notable for the following components:   WBC 11.0 (*)    All other components within normal limits  URINALYSIS, ROUTINE W REFLEX MICROSCOPIC - Abnormal; Notable for the following components:   Color, Urine STRAW (*)    APPearance CLEAR (*)    All other components within normal limits  HEPATIC FUNCTION PANEL - Abnormal; Notable for the following components:   Total Protein 8.2 (*)    All other components within normal limits  LIPASE, BLOOD  SEDIMENTATION RATE   ____________________________________________  EKG  none  ____________________________________________  RADIOLOGY  I have personally reviewed the images performed during this visit and I agree with the Radiologist's read.   Interpretation by Radiologist:  Mr Thoracic Spine W Wo Contrast  Result Date: 12/25/2018 CLINICAL DATA:  Initial evaluation for acute mid right-sided back pain. EXAM: MRI THORACIC WITHOUT AND WITH CONTRAST TECHNIQUE: Multiplanar and multiecho pulse sequences of the thoracic spine were obtained without and with intravenous contrast. CONTRAST:  7 cc of Gadavist. COMPARISON:  None available. FINDINGS: MRI THORACIC SPINE FINDINGS Alignment: Vertebral bodies normally aligned with preservation of the normal thoracic kyphosis. No listhesis or subluxation. Vertebrae: Vertebral body height maintained without evidence for acute or chronic fracture. Few scattered chronic endplate Schmorl's nodes present within the mid and lower thoracic spine, most notable about the  T11-12 interspace. Underlying bone marrow signal intensity within normal limits. No discrete or worrisome osseous lesions. No abnormal marrow edema or enhancement. Cord: Signal intensity within the thoracic spinal cord is normal. Normal cord caliber morphology. Paraspinal and other soft tissues: Paraspinous soft tissues demonstrate no acute finding. Small layering right pleural effusion noted. Visualized visceral structures within normal limits. Disc levels: T1-2:  Unremarkable. T2-3: Unremarkable. T3-4:  Unremarkable. T4-5:  Negative interspace.  Mild facet hypertrophy.  No stenosis. T5-6:  Negative interspace.  Mild facet hypertrophy.  No stenosis. T6-7: Central disc protrusion indents the ventral thecal sac, flattening the ventral thoracic cord (series 24, image 20). Slight superior and inferior migration of disc material noted. Mild facet hypertrophy. No significant spinal stenosis. Foramina remain patent. T7-8:  Negative interspace.  Mild facet hypertrophy.  No stenosis. T8-9: Right paracentral disc protrusion indents the right ventral thecal sac (series 24, image 25). Mild flattening of the ventral right cord without cord signal changes. Mild facet hypertrophy. No significant stenosis. T9-10: Negative interspace.  Mild facet hypertrophy.  No  stenosis. T10-11: Chronic intervertebral disc space narrowing. Small central disc protrusion indents the ventral thecal sac (series 24, image 32). No significant cord deformity or stenosis. Mild facet hypertrophy. T11-12: Mild right eccentric disc bulge. Mild facet hypertrophy. No stenosis. T12-L1:  Negative interspace.  Mild facet hypertrophy.  No stenosis. IMPRESSION: 1. No acute abnormality within the thoracic spine. 2. Multilevel small to moderate central and right paracentral disc protrusions at T6-7, T8-9, and T10-11 as above, which could contribute to mid and right-sided back pain. No significant stenosis. 3. Small right pleural effusion. Electronically Signed   By:  Jeannine Boga M.D.   On: 12/25/2018 02:16   Ct Renal Stone Study  Result Date: 12/25/2018 CLINICAL DATA:  Flank pain, stone disease suspected EXAM: CT ABDOMEN AND PELVIS WITHOUT CONTRAST TECHNIQUE: Multidetector CT imaging of the abdomen and pelvis was performed following the standard protocol without IV contrast. COMPARISON:  None. FINDINGS: Lower chest: Basilar atelectasis. Lung bases are clear. Normal heart size. No pericardial effusion. Hepatobiliary: No focal liver abnormality is seen. The gallbladder is not visualized, correlate with surgical history. No biliary ductal dilatation or calcified intraductal gallstones. Pancreas: Unremarkable. No pancreatic ductal dilatation or surrounding inflammatory changes. Spleen: Normal in size without focal abnormality. Adrenals/Urinary Tract: Adrenal glands are unremarkable. Kidneys are normal, without renal calculi, focal lesion, or hydronephrosis. Bladder is unremarkable. Stomach/Bowel: Distal esophagus, stomach and duodenal sweep are unremarkable. No bowel wall thickening or dilatation. No evidence of obstruction. Patient appears to be post right hemicolectomy with a patent ileocolic anastomosis in the right lower quadrant. Vascular/Lymphatic: The aorta is normal caliber. Small amount of calcified atheromatous plaque in the right common iliac. No suspicious or enlarged lymph nodes in the included lymphatic chains. Reproductive: The prostate and seminal vesicles are unremarkable. Other: No abdominopelvic free fluid or free gas. No bowel containing hernias. Musculoskeletal: Multilevel degenerative changes are present in the imaged portions of the spine. No acute osseous abnormality or suspicious osseous lesion. IMPRESSION: No acute intra-abdominal process. Specifically, no urolithiasis or hydronephrosis. Nonvisualization of the gallbladder, correlate with surgical history. Appearance of prior right hemicolectomy with patent ileocolic anastomosis in the right  lower quadrant. Electronically Signed   By: Lovena Le M.D.   On: 12/25/2018 00:06      ____________________________________________   PROCEDURES  Procedure(s) performed: None Procedures Critical Care performed:  None ____________________________________________   INITIAL IMPRESSION / ASSESSMENT AND PLAN / ED COURSE  48 y.o. male with h/o remote colon cancer 14 years ago s/p resection and chemo who presents for evaluation of R flank pain x 24 hours with no associated symptoms.  Ddx kidney stone, pyelonephritis, gallbladder disease, AAA, MSK, discitis, disc disease, compression fracture, pathological fracture.  Patient is well-appearing in no distress with normal vital signs, no CVA tenderness, abdomen is soft with no tenderness throughout, no pulsatile mass, no midline C/T/L spine tenderness, no rash, normal DTRs and distal pulses, normal neuro exam.  UA negative for UTI or blood.  CBC with mild leukocytosis with white count of 11, normal BMP.  Will add LFTs and lipase, will send patient for CT renal.  Will give Toradol for pain.    _________________________ 2:22 AM on 12/25/2018 -----------------------------------------  CT negative for acute pathology. Normal ESR, LFT, lipase. MRI showing several thoracic disc protrusions with no significant central canal stenosis. Patient is neurologically intact. Will dc home on supportive care and follow up with PCP. Discussed my standard return precautions.     As part of my medical decision making, I  reviewed the following data within the [facility] notes reviewed and incorporated, Labs reviewed , Old chart reviewed, Radiograph reviewed , Notes from prior ED visits and [place] Controlled Substance Database   Patient was evaluated in Emergency Department today for the symptoms described in the history of present illness. Patient was evaluated in the context of the global COVID-19 pandemic, which necessitated consideration  that the patient might be at risk for infection with the SARS-CoV-2 virus that causes COVID-19. Institutional protocols and algorithms that pertain to the evaluation of patients at risk for COVID-19 are in a state of rapid change based on information released by regulatory bodies including the CDC and federal and state organizations. These policies and algorithms were followed during the patient's care in the ED.   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    Rudene Re, MD 12/25/18 [phone]

## 2018-12-24 NOTE — ED Triage Notes (Signed)
Pt to the er for right sided mid back pain over the kidney. Denies radiation. Pt appears uncomfortable. Pain began in the middle of the night last night and has not improved. Denies any injury to his back.

## 2018-12-25 ENCOUNTER — Emergency Department: Payer: 59

## 2018-12-25 LAB — LIPASE, BLOOD: Lipase: 42 U/L (ref 11–51)

## 2018-12-25 LAB — SEDIMENTATION RATE: Sed Rate: 4 mm/hr (ref 0–15)

## 2018-12-25 LAB — HEPATIC FUNCTION PANEL
ALT: 23 U/L (ref 0–44)
AST: 24 U/L (ref 15–41)
Albumin: 5 g/dL (ref 3.5–5.0)
Alkaline Phosphatase: 54 U/L (ref 38–126)
Bilirubin, Direct: 0.1 mg/dL (ref 0.0–0.2)
Indirect Bilirubin: 0.7 mg/dL (ref 0.3–0.9)
Total Bilirubin: 0.8 mg/dL (ref 0.3–1.2)
Total Protein: 8.2 g/dL — ABNORMAL HIGH (ref 6.5–8.1)

## 2018-12-25 MED ORDER — GADOBUTROL 1 MMOL/ML IV SOLN
7.0000 mL | Freq: Once | INTRAVENOUS | Status: AC | PRN
  Administered 2018-12-25: 02:00:00 7 mL via INTRAVENOUS

## 2018-12-25 NOTE — Discharge Instructions (Addendum)
For pain take 1000 mg of Tylenol 3 times a day or 600 mg of ibuprofen every 6 hours with a meal or snack to prevent ulcers in your stomach.  Apply heat.  Follow-up with your primary care doctor.  Return to the emergency room for weakness or numbness of your legs, numbness of your groin, urinary or bowel incontinence or retention, worsening pain.

## 2018-12-29 DIAGNOSIS — D72829 Elevated white blood cell count, unspecified: Secondary | ICD-10-CM | POA: Insufficient documentation

## 2018-12-29 DIAGNOSIS — J90 Pleural effusion, not elsewhere classified: Secondary | ICD-10-CM | POA: Insufficient documentation

## 2018-12-29 DIAGNOSIS — N289 Disorder of kidney and ureter, unspecified: Secondary | ICD-10-CM | POA: Insufficient documentation

## 2018-12-29 DIAGNOSIS — M5124 Other intervertebral disc displacement, thoracic region: Secondary | ICD-10-CM | POA: Insufficient documentation

## 2018-12-29 DIAGNOSIS — Z09 Encounter for follow-up examination after completed treatment for conditions other than malignant neoplasm: Secondary | ICD-10-CM | POA: Insufficient documentation

## 2018-12-29 NOTE — Assessment & Plan Note (Addendum)
Due for labs today. LDL was high two years ago but his asvd risk score remained low at that time. Repeat labs today. Weight has remained stable.  Orders Placed This Encounter  Procedures  . DG Chest 2 View  . CBC with Differential/Platelet  . Pathologist smear review  . Lipid panel  . Comprehensive metabolic panel  . PSA  . HIV antibody (with reflex)  . Ferritin

## 2018-12-29 NOTE — Assessment & Plan Note (Addendum)
Significant disease on MRI:   IMPRESSION: 1. No acute abnormality within the thoracic spine. 2. Multilevel small to moderate central and right paracentral disc protrusions at T6-7, T8-9, and T10-11 as above, which could contribute to mid and right-sided back pain. No significant stenosis. 3. Small right pleural effusion. Electronically Signed   By: Jeannine Boga M.D.   On: 12/25/2018 02:16   Refer to spine specialist/neurosurgeon and PT.

## 2018-12-29 NOTE — Assessment & Plan Note (Signed)
?   Reactive.  Will check complete CBC with diff and path smear today.

## 2018-12-29 NOTE — Assessment & Plan Note (Signed)
Likely pre renal. Check CMET today.

## 2018-12-29 NOTE — Assessment & Plan Note (Addendum)
Added ferritin. Followed closely by GI.  Colonoscopy not due until 3/21.

## 2018-12-29 NOTE — Progress Notes (Signed)
Subjective:   Patient ID: Robert Guzman, male    DOB: Apr 07, 1971, 48 y.o.   MRN: [id number]  Robert Guzman is a pleasant 48 y.o. year old male who presents to clinic today with Annual Exam (Pt is currently fasting.  He agrees to his Tdap and Flu shots.) and ER Follow up (He has 2 herniated discs. He says that the pain has decreased. He was told to take some Tylenol and referred him here. He will need referral.)  on 12/31/2018  HPI:  Health Maintenance  Topic Date Due   HIV Screening  02/19/1988   TETANUS/TDAP  02/19/1992   INFLUENZA VACCINE  11/22/2018    I last saw patient for CPX on 01/10/19- chart extensively reviewed.    Colon CA- diagnosed at age 48 in 2006.   S/p colectomy and 6 months of chemotherapy.  Was following yearly with Dr. Oliva Bustard but was told he no longer needed to follow up with him.  Colonoscopy 3/17(Dr. Fleet Contras)- 5 year recall. Has had no blood in his stool and change in bowel habits.  He presented to the ED on 12/24/18 (last week) for right flank pain which started the night prior. In the ED he described the pain as f dull and constant- 2/10 and non radiating.  No trauma, no rash, no N/V, no abdominal pain, no fever, no dysuria or hematuria, no CP  no weight loss,  no saddle anesthesia, no lower extremity weakness or numbness. No prior h/o kidney stone. Took ibuprofen which improved the pain. 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 He was also very short of breath.  That has resolved.  Per exam documented by ED provider, "Patient is well-appearing in no distress with normal vital signs, no CVA tenderness, abdomen is soft with no tenderness throughout, no pulsatile mass, no midline C/T/L spine tenderness, no rash, normal DTRs and distal pulses, normal neuro exam.  UA negative for UTI or blood.  CBC with mild leukocytosis with white count of 11, normal BMP."  Lab Results  Component Value Date   WBC 11.0 (H)  12/24/2018   HGB 16.3 12/24/2018   HCT 46.6 12/24/2018   MCV 86.0 12/24/2018   PLT 194 12/24/2018    Additional labs were checked- lipase and lfts.  Both normal.  Given toradol for pain and sent for renal stone CT. MRI of thoracic spine was then also ordered.  CT negative for acute pathology.MRI showing several thoracic disc protrusions with no significant central canal stenosis. Patient advised to follow up with me.  Lab Results  Component Value Date   ALT 23 12/24/2018   AST 24 12/24/2018   ALKPHOS 54 12/24/2018   BILITOT 0.8 12/24/2018   Lab Results  Component Value Date   NA 139 12/24/2018   K 3.6 12/24/2018   CL 102 12/24/2018   CO2 27 12/24/2018   Acute renal insufficieny- ? Prerenal. Lab Results  Component Value Date   CREATININE 1.24 12/24/2018    Lab Results  Component Value Date   LIPASE 42 12/24/2018   New pleural effusion seen on MRI of thoracic spine-   Mr Thoracic Spine W Wo Contrast  Result Date: 12/25/2018 CLINICAL DATA:  Initial evaluation for acute mid right-sided back pain. EXAM: MRI THORACIC WITHOUT AND WITH CONTRAST TECHNIQUE: Multiplanar and multiecho pulse sequences of the thoracic spine were obtained without and with intravenous contrast. CONTRAST:  7 cc of Gadavist. COMPARISON:  None available. FINDINGS:  MRI THORACIC SPINE FINDINGS Alignment: Vertebral bodies normally aligned with preservation of the normal thoracic kyphosis. No listhesis or subluxation. Vertebrae: Vertebral body height maintained without evidence for acute or chronic fracture. Few scattered chronic endplate Schmorl's nodes present within the mid and lower thoracic spine, most notable about the T11-12 interspace. Underlying bone marrow signal intensity within normal limits. No discrete or worrisome osseous lesions. No abnormal marrow edema or enhancement. Cord: Signal intensity within the thoracic spinal cord is normal. Normal cord caliber morphology. Paraspinal and other soft tissues:  Paraspinous soft tissues demonstrate no acute finding. Small layering right pleural effusion noted. Visualized visceral structures within normal limits. Disc levels: T1-2:  Unremarkable. T2-3: Unremarkable. T3-4:  Unremarkable. T4-5:  Negative interspace.  Mild facet hypertrophy.  No stenosis. T5-6:  Negative interspace.  Mild facet hypertrophy.  No stenosis. T6-7: Central disc protrusion indents the ventral thecal sac, flattening the ventral thoracic cord (series 24, image 20). Slight superior and inferior migration of disc material noted. Mild facet hypertrophy. No significant spinal stenosis. Foramina remain patent. T7-8:  Negative interspace.  Mild facet hypertrophy.  No stenosis. T8-9: Right paracentral disc protrusion indents the right ventral thecal sac (series 24, image 25). Mild flattening of the ventral right cord without cord signal changes. Mild facet hypertrophy. No significant stenosis. T9-10: Negative interspace.  Mild facet hypertrophy.  No stenosis. T10-11: Chronic intervertebral disc space narrowing. Small central disc protrusion indents the ventral thecal sac (series 24, image 32). No significant cord deformity or stenosis. Mild facet hypertrophy. T11-12: Mild right eccentric disc bulge. Mild facet hypertrophy. No stenosis. T12-L1:  Negative interspace.  Mild facet hypertrophy.  No stenosis. IMPRESSION: 1. No acute abnormality within the thoracic spine. 2. Multilevel small to moderate central and right paracentral disc protrusions at T6-7, T8-9, and T10-11 as above, which could contribute to mid and right-sided back pain. No significant stenosis. 3. Small right pleural effusion. Electronically Signed   By: Jeannine Boga M.D.   On: 12/25/2018 02:16   Ct Renal Stone Study  Result Date: 12/25/2018 CLINICAL DATA:  Flank pain, stone disease suspected EXAM: CT ABDOMEN AND PELVIS WITHOUT CONTRAST TECHNIQUE: Multidetector CT imaging of the abdomen and pelvis was performed following the standard  protocol without IV contrast. COMPARISON:  None. FINDINGS: Lower chest: Basilar atelectasis. Lung bases are clear. Normal heart size. No pericardial effusion. Hepatobiliary: No focal liver abnormality is seen. The gallbladder is not visualized, correlate with surgical history. No biliary ductal dilatation or calcified intraductal gallstones. Pancreas: Unremarkable. No pancreatic ductal dilatation or surrounding inflammatory changes. Spleen: Normal in size without focal abnormality. Adrenals/Urinary Tract: Adrenal glands are unremarkable. Kidneys are normal, without renal calculi, focal lesion, or hydronephrosis. Bladder is unremarkable. Stomach/Bowel: Distal esophagus, stomach and duodenal sweep are unremarkable. No bowel wall thickening or dilatation. No evidence of obstruction. Patient appears to be post right hemicolectomy with a patent ileocolic anastomosis in the right lower quadrant. Vascular/Lymphatic: The aorta is normal caliber. Small amount of calcified atheromatous plaque in the right common iliac. No suspicious or enlarged lymph nodes in the included lymphatic chains. Reproductive: The prostate and seminal vesicles are unremarkable. Other: No abdominopelvic free fluid or free gas. No bowel containing hernias. Musculoskeletal: Multilevel degenerative changes are present in the imaged portions of the spine. No acute osseous abnormality or suspicious osseous lesion. IMPRESSION: No acute intra-abdominal process. Specifically, no urolithiasis or hydronephrosis. Nonvisualization of the gallbladder, correlate with surgical history. Appearance of prior right hemicolectomy with patent ileocolic anastomosis in the right lower quadrant.  Electronically Signed   By: Lovena Le M.D.   On: 12/25/2018 00:06   H/o HLD- at one point was diet controlled but overdue for labs. Lab Results  Component Value Date   CHOL 191 01/04/2017   HDL 49.90 01/04/2017   LDLCALC 111 (H) 01/04/2017   TRIG 151.0 (H) 01/04/2017    CHOLHDL 4 01/04/2017   The 10-year ASCVD risk score Mikey Bussing DC Jr., et al., 2013) is: 1.8%   Values used to calculate the score:     Age: 71 years     Sex: Male     Is Non-Hispanic African American: No     Diabetic: No     Tobacco smoker: No     Systolic Blood Pressure: 123XX123 mmHg     Is BP treated: No     HDL Cholesterol: 49.9 mg/dL     Total Cholesterol: 191 mg/dL  No current outpatient medications on file prior to visit.   No current facility-administered medications on file prior to visit.    Depression screen [facility] 2/9 12/31/2018 01/09/2017  Decreased Interest 0 0  Down, Depressed, Hopeless 1 0  PHQ - 2 Score 1 0  Altered sleeping 1 -  Tired, decreased energy 1 -  Change in appetite 0 -  Feeling bad or failure about yourself  0 -  Trouble concentrating 0 -  Moving slowly or fidgety/restless 0 -  Suicidal thoughts 0 -  PHQ-9 Score 3 -  Difficult doing work/chores Somewhat difficult -   GAD 7 : Generalized Anxiety Score 12/31/2018  Nervous, Anxious, on Edge 1  Control/stop worrying 1  Worry too much - different things 1  Trouble relaxing 1  Restless 1  Easily annoyed or irritable 1  Afraid - awful might happen 1  Total GAD 7 Score 7  Anxiety Difficulty Somewhat difficult    Pain today is about a 1 or a 2 compared to what it was in the ED.  Pain is on right side.  No Known Allergies  Past Medical History:  Diagnosis Date   Colon cancer ([place]) 2006   COLON/ chemo   Encounter for blood transfusion    for colon cancer   Genetic testing 2007   Negative     Past Surgical History:  Procedure Laterality Date   COLON SURGERY  Nov 2006   COLONOSCOPY  05-28-12   COLONOSCOPY WITH PROPOFOL N/A 07/13/2015   Procedure: COLONOSCOPY WITH PROPOFOL;  Surgeon: Benz Bellow, MD;  Location[facility] ENDOSCOPY;  Service: Endoscopy;  Laterality: N/A;   TOOTH EXTRACTION      Family History  Problem Relation Age of Onset   Cancer Mother    Diabetes Father    Hypertension  Father     Social History   Socioeconomic History   Marital status: Married    Spouse name: Not on file   Number of children: Not on file   Years of education: Not on file   Highest education level: Not on file  Occupational History   Not on file  Social Needs   Financial resource strain: Not on file   Food insecurity    Worry: Not on file    Inability: Not on file   Transportation needs    Medical: Not on file    Non-medical: Not on file  Tobacco Use   Smoking status: Former Smoker   Smokeless tobacco: Never Used  Substance and Sexual Activity   Alcohol use: No   Drug use: No  Sexual activity: Yes  Lifestyle   Physical activity    Days per week: Not on file    Minutes per session: Not on file   Stress: Not on file  Relationships   Social connections    Talks on phone: Not on file    Gets together: Not on file    Attends religious service: Not on file    Active member of club or organization: Not on file    Attends meetings of clubs or organizations: Not on file    Relationship status: Not on file   Intimate partner violence    Fear of current or ex partner: Not on file    Emotionally abused: Not on file    Physically abused: Not on file    Forced sexual activity: Not on file  Other Topics Concern   Not on file  Social History Narrative   Engineer, 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  Review of Systems  HENT: Negative.   Respiratory: Positive for shortness of breath.   Cardiovascular: Negative.   Gastrointestinal: Negative.   Endocrine: Negative.   Genitourinary: Negative.   Musculoskeletal: Positive for back pain.  Skin: Negative.   Allergic/Immunologic: Negative.   Neurological: Negative.   Hematological: Negative.   Psychiatric/Behavioral: Negative.        Objective:    BP 126/70 (BP  Location: Left Arm, Patient Position: Sitting, Cuff Size: Normal)    Pulse 96    Temp 98.5 F (36.9 C) (Oral)    Ht 5' 9.5" (1.765 m)    Wt 183 lb 3.2 oz (83.1 kg)    SpO2 97%    BMI 26.67 kg/m  Wt Readings from Last 3 Encounters:  12/31/18 183 lb 3.2 oz (83.1 kg)  12/24/18 180 lb (81.6 kg)  01/09/17 177 lb 12.8 oz (80.6 kg)     Physical Exam  General:  pleasant male in no acute distress Eyes:  PERRL Ears:  External ear exam shows no significant lesions or deformities.  TMs normal bilaterally Hearing is grossly normal bilaterally. Nose:  External nasal examination shows no deformity or inflammation. Nasal mucosa are pink and moist without lesions or exudates. Mouth:  Oral mucosa and oropharynx without lesions or exudates.  Teeth in good repair. Neck:  no carotid bruit or thyromegaly no cervical or supraclavicular lymphadenopathy  Lungs:  Normal respiratory effort, chest expands symmetrically. Lungs are clear to auscultation, no crackles or wheezes. Heart:  Normal rate and regular rhythm. S1 and S2 normal without gallop, murmur, click, rub or other extra sounds. Abdomen:  Bowel sounds positive,abdomen soft and non-tender without masses, organomegaly or hernias noted.. 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 - Plan: Ambulatory referral to Spine Surgery, Ambulatory referral to Physical Therapy  Pure hypertriglyceridemia - Plan: Lipid panel  Leukocytosis, unspecified type - Plan: CBC with Differential/Platelet, Pathologist smear review  Acute renal insufficiency - Plan: Comprehensive metabolic panel  Screening for HIV (human immunodeficiency virus) - Plan: HIV  antibody (with  reflex)  Screening for prostate cancer - Plan: PSA  Pleural effusion on right - Plan: DG Chest 2 View  Need for influenza vaccination - Plan: Flu Vaccine QUAD 6+ mos PF IM (Fluarix Quad PF)  Need for Tdap vaccination - Plan: Tdap vaccine greater than or equal to 7yo IM No follow-ups on file.

## 2018-12-30 ENCOUNTER — Telehealth: Payer: Self-pay

## 2018-12-30 NOTE — Telephone Encounter (Signed)
Questions for Screening COVID-19  Symptom onset: None  Travel or Contacts: None  During this illness, did/does the patient experience any of the following symptoms? Fever >100.49F []   Yes [x]   No []   Unknown Subjective fever (felt feverish) []   Yes [x]   No []   Unknown Chills []   Yes [x]   No []   Unknown Muscle aches (myalgia) []   Yes [x]   No []   Unknown Runny nose (rhinorrhea) []   Yes [x]   No []   Unknown Sore throat []   Yes [x]   No []   Unknown Cough (new onset or worsening of chronic cough) []   Yes []   No []   Unknown Shortness of breath (dyspnea) []   Yes [x]   No []   Unknown Nausea or vomiting []   Yes [x]   No []   Unknown Headache []   Yes [x]   No []   Unknown Abdominal pain  []   Yes [x]   No []   Unknown Diarrhea (?3 loose/looser than normal stools/24hr period) []   Yes [x]   No []   Unknown Other, specify:  Patient risk factors: Smoker? []   Current []   Former []   Never If male, currently pregnant? []   Yes []   No  Patient Active Problem List   Diagnosis Date Noted  . Encounter for examination following treatment at hospital 12/29/2018  . 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 Leukocytosis 12/29/2018  . Acute renal insufficiency 12/29/2018  . Pleural effusion on right 12/29/2018  . HLD (hyperlipidemia) 01/09/2016  . History of colon cancer 06/02/2015  . History of colonic polyps 06/02/2015  . Visit for well man health check 01/05/2015    Plan:  []   High risk for COVID-19 with red flags go to ED (with CP, SOB, weak/lightheaded, or fever > 101.5). Call ahead.  []   High risk for COVID-19 but stable. Inform provider and coordinate time for [facility] visit.   []   No red flags but URI signs or symptoms okay for [facility] visit.

## 2018-12-31 ENCOUNTER — Other Ambulatory Visit: Payer: Self-pay

## 2018-12-31 ENCOUNTER — Ambulatory Visit (INDEPENDENT_AMBULATORY_CARE_PROVIDER_SITE_OTHER): Payer: 59 | Admitting: Family Medicine

## 2018-12-31 ENCOUNTER — Ambulatory Visit (INDEPENDENT_AMBULATORY_CARE_PROVIDER_SITE_OTHER): Payer: 59

## 2018-12-31 ENCOUNTER — Encounter: Payer: Self-pay | Admitting: Family Medicine

## 2018-12-31 VITALS — BP 126/70 | HR 96 | Temp 98.5°F | Ht 69.5 in | Wt 183.2 lb

## 2018-12-31 DIAGNOSIS — D72829 Elevated white blood cell count, unspecified: Secondary | ICD-10-CM | POA: Diagnosis not present

## 2018-12-31 DIAGNOSIS — Z09 Encounter for follow-up examination after completed treatment for conditions other than malignant neoplasm: Secondary | ICD-10-CM

## 2018-12-31 DIAGNOSIS — Z85038 Personal history of other malignant neoplasm of large intestine: Secondary | ICD-10-CM

## 2018-12-31 DIAGNOSIS — Z114 Encounter for screening for human immunodeficiency virus [HIV]: Secondary | ICD-10-CM | POA: Diagnosis not present

## 2018-12-31 DIAGNOSIS — J90 Pleural effusion, not elsewhere classified: Secondary | ICD-10-CM | POA: Diagnosis not present

## 2018-12-31 DIAGNOSIS — M5124 Other intervertebral disc displacement, thoracic region: Secondary | ICD-10-CM

## 2018-12-31 DIAGNOSIS — Z23 Encounter for immunization: Secondary | ICD-10-CM

## 2018-12-31 DIAGNOSIS — F418 Other specified anxiety disorders: Secondary | ICD-10-CM | POA: Diagnosis not present

## 2018-12-31 DIAGNOSIS — Z125 Encounter for screening for malignant neoplasm of prostate: Secondary | ICD-10-CM | POA: Diagnosis not present

## 2018-12-31 DIAGNOSIS — E781 Pure hyperglyceridemia: Secondary | ICD-10-CM | POA: Diagnosis not present

## 2018-12-31 DIAGNOSIS — N289 Disorder of kidney and ureter, unspecified: Secondary | ICD-10-CM | POA: Diagnosis not present

## 2018-12-31 DIAGNOSIS — Z0000 Encounter for general adult medical examination without abnormal findings: Secondary | ICD-10-CM

## 2018-12-31 LAB — COMPREHENSIVE METABOLIC PANEL
ALT: 64 U/L — ABNORMAL HIGH (ref 0–53)
AST: 26 U/L (ref 0–37)
Albumin: 4.5 g/dL (ref 3.5–5.2)
Alkaline Phosphatase: 110 U/L (ref 39–117)
BUN: 10 mg/dL (ref 6–23)
CO2: 27 mEq/L (ref 19–32)
Calcium: 9.5 mg/dL (ref 8.4–10.5)
Chloride: 102 mEq/L (ref 96–112)
Creatinine, Ser: 1.03 mg/dL (ref 0.40–1.50)
GFR: 77.79 mL/min (ref >60.00)
Glucose, Bld: 93 mg/dL (ref 70–99)
Potassium: 4.1 mEq/L (ref 3.5–5.1)
Sodium: 140 mEq/L (ref 135–145)
Total Bilirubin: 1 mg/dL (ref 0.2–1.2)
Total Protein: 7.8 g/dL (ref 6.0–8.3)

## 2018-12-31 LAB — LIPID PANEL
Cholesterol: 208 mg/dL — ABNORMAL HIGH (ref 0–200)
HDL: 36.5 mg/dL — ABNORMAL LOW (ref >39.00)
LDL Cholesterol: 147 mg/dL — ABNORMAL HIGH (ref 0–99)
NonHDL: 171.26
Total CHOL/HDL Ratio: 6
Triglycerides: 119 mg/dL (ref 0.0–149.0)
VLDL: 23.8 mg/dL (ref 0.0–40.0)

## 2018-12-31 LAB — CBC WITH DIFFERENTIAL/PLATELET
Basophils Absolute: 0.1 10*3/uL (ref 0.0–0.1)
Basophils Relative: 0.9 % (ref 0.0–3.0)
Eosinophils Absolute: 0.1 10*3/uL (ref 0.0–0.7)
Eosinophils Relative: 1 % (ref 0.0–5.0)
HCT: 45.2 % (ref 39.0–52.0)
Hemoglobin: 15.5 g/dL (ref 13.0–17.0)
Lymphocytes Relative: 22.2 % (ref 12.0–46.0)
Lymphs Abs: 1.3 10*3/uL (ref 0.7–4.0)
MCHC: 34.4 g/dL (ref 30.0–36.0)
MCV: 88.1 fl (ref 78.0–100.0)
Monocytes Absolute: 0.5 10*3/uL (ref 0.1–1.0)
Monocytes Relative: 8.4 % (ref 3.0–12.0)
Neutro Abs: 4 10*3/uL (ref 1.4–7.7)
Neutrophils Relative %: 67.5 % (ref 43.0–77.0)
Platelets: 257 10*3/uL (ref 150.0–400.0)
RBC: 5.13 Mil/uL (ref 4.22–5.81)
RDW: 14.3 % (ref 11.5–15.5)
WBC: 5.9 10*3/uL (ref 4.0–10.5)

## 2018-12-31 LAB — PSA: PSA: 0.51 ng/mL (ref 0.10–4.00)

## 2018-12-31 LAB — FERRITIN: Ferritin: 211.6 ng/mL (ref 22.0–322.0)

## 2018-12-31 NOTE — Assessment & Plan Note (Signed)
Reviewed preventive care protocols, scheduled due services, and updated immunizations Discussed nutrition, exercise, diet, and healthy lifestyle.  Received Tdap and Flu.

## 2018-12-31 NOTE — Addendum Note (Signed)
Addended by: Lynnea Ferrier on: 12/31/2018 09:51 AM   Modules accepted: Orders

## 2018-12-31 NOTE — Assessment & Plan Note (Signed)
Noted on MRI.  CXR today for further evaluation. The patient indicates understanding of these issues and agrees with the plan.

## 2018-12-31 NOTE — Assessment & Plan Note (Addendum)
MRI of thoracic spine was then also ordered.  CT negative for acute pathology.MRI showing several thoracic disc protrusions with no significant central canal stenosis.  I advised referral to neurosurgery.  Pt agreed.  I am also concerned about new pleural effusion- see below.

## 2018-12-31 NOTE — Assessment & Plan Note (Signed)
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 He will let me know if symptoms worsen. The patient indicates understanding of these issues and agrees with the plan.

## 2018-12-31 NOTE — Patient Instructions (Addendum)
Great to see you! I will call you with your results from today and you can view them online.   I have also referred you to spine specialist and a physical therapist.  Also ordering a Chest xray.  We are referring you to GI.  Herniated Disk Rehab Ask your health care provider which exercises are safe for you. Do exercises exactly as told by your health care provider and adjust them as directed. It is normal to feel mild stretching, pulling, tightness, or discomfort as you do these exercises. Stop right away if you feel sudden pain or your pain gets worse. Do not begin these exercises until told by your health care provider. Stretching and range-of-motion exercises These exercises warm up your muscles and joints and improve the movement and flexibility of your back. These exercises also help to relieve pain, numbness, and tingling. Prone extension on elbows  1. Lie on your abdomen on a firm surface (prone position). 2. Prop yourself up on your elbows. 3. Use your arms to help lift your chest up until you feel a gentle stretch in your abdomen and your lower back. ? This will place some of your body weight on your elbows. If this is uncomfortable, try stacking pillows under your chest. ? Your hips should stay down, against the surface that you are lying on. Keep your hip and back muscles relaxed. 4. Hold this position for __________ seconds. 5. Slowly relax your upper body and return to the starting position. Repeat __________ times. Complete this exercise __________ times a day. Standing extension 1. Stand with your feet shoulder-width apart. 2. Place your hands on your lower back, with your palms on your back. 3. Gently arch your back (extension) and press forward with your hands. Keep your hips over your toes. Do not let your hips drift forward while arching your back. 4. Hold this position for __________ seconds. 5. Slowly return to the starting position. Repeat __________ times.  Complete this exercise __________ times a day. Strengthening exercises These exercises build strength and endurance in your back. Endurance is the ability to use your muscles for a long time, even after they get tired. Pelvic tilt 1. Lie on your back on a firm surface. Bend your knees and keep your feet flat. 2. Tense your abdominal muscles. Tip your pelvis up toward the ceiling and flatten your lower back into the floor. ? To help with this exercise, you may place a small towel under your lower back and try to push your back into the towel. 3. Hold this position for __________ seconds. 4. Let your muscles relax completely before you repeat this exercise. Repeat __________ times. Complete this exercise __________ times a day. Alternating arm and leg raises  1. Get on your hands and knees on a firm surface. If you are on a hard floor, you may want to use padding, such as an exercise mat, to cushion your knees. 2. Line up your arms and legs. Your hands should be below your shoulders, and your knees should be below your hips. 3. Lift your left leg behind you. At the same time, raise your right arm and straighten it in front of you. ? Do not lift your leg higher than your hip. ? Do not lift your arm higher than your shoulder. ? Keep your abdominal and back muscles tight. ? Keep your hips facing the ground. ? Do not arch your back. ? Keep your balance carefully, and do not hold your breath. 4. Hold  this position for __________ seconds. 5. Slowly return to the starting position and repeat with your right leg and your left arm. Repeat __________ times. Complete this exercise __________ times a day. Bridge  1. Lie on your back on a firm surface with your knees bent and your feet flat on the floor. 2. Tighten your abdominal and buttocks muscles and lift your bottom off the floor until your trunk and hips are level with your thighs. ? You should feel the muscles working in your buttocks and the  back of your thighs. ? Do not arch your back. ? If this exercise is too easy, try doing it with your arms crossed over your chest. 3. Hold this position for __________ seconds. 4. Slowly lower your hips to the starting position. 5. Let your muscles relax completely after each repetition. Repeat __________ times. Complete this exercise __________ times a day. This information is not intended to replace advice given to you by your health care provider. Make sure you discuss any questions you have with your health care provider. Document Released: 04/09/2005 Document Revised: 07/31/2018 Document Reviewed: 02/05/2018 Elsevier Patient Education  2020 [employer].

## 2019-01-01 ENCOUNTER — Encounter: Payer: Self-pay | Admitting: Family Medicine

## 2019-01-01 LAB — PATHOLOGIST SMEAR REVIEW

## 2019-01-01 LAB — HIV ANTIBODY (ROUTINE TESTING W REFLEX): HIV 1&2 Ab, 4th Generation: NONREACTIVE

## 2019-01-02 ENCOUNTER — Encounter: Payer: Self-pay | Admitting: Family Medicine

## 2019-01-05 DIAGNOSIS — Z6826 Body mass index (BMI) 26.0-26.9, adult: Secondary | ICD-10-CM | POA: Insufficient documentation

## 2019-01-05 DIAGNOSIS — I10 Essential (primary) hypertension: Secondary | ICD-10-CM | POA: Insufficient documentation

## 2019-01-05 DIAGNOSIS — M549 Dorsalgia, unspecified: Secondary | ICD-10-CM | POA: Insufficient documentation

## 2019-01-06 ENCOUNTER — Telehealth: Payer: Self-pay

## 2019-01-06 NOTE — Telephone Encounter (Signed)
Copied from [place] [id number]. Topic: General - Inquiry >> Jan 06, 2019  3:03 PM Scherrie Gerlach wrote: Reason for CRM: pt wants Dr Deborra Medina to give him call in the am. He wants to discuss what the spine surgeon told him concerning his herniated disc and discuss another chest xray

## 2019-01-07 ENCOUNTER — Ambulatory Visit: Payer: Self-pay | Admitting: *Deleted

## 2019-01-07 ENCOUNTER — Ambulatory Visit: Payer: 59 | Admitting: Nurse Practitioner

## 2019-01-07 ENCOUNTER — Encounter: Payer: Self-pay | Admitting: Nurse Practitioner

## 2019-01-07 ENCOUNTER — Other Ambulatory Visit: Payer: Self-pay

## 2019-01-07 ENCOUNTER — Ambulatory Visit: Payer: 59

## 2019-01-07 VITALS — BP 130/86 | HR 83 | Temp 98.1°F | Ht 69.5 in | Wt 181.0 lb

## 2019-01-07 DIAGNOSIS — M546 Pain in thoracic spine: Secondary | ICD-10-CM

## 2019-01-07 NOTE — Telephone Encounter (Signed)
notified Liane Comber; he left message left on pt's voicemail.

## 2019-01-07 NOTE — Progress Notes (Signed)
Subjective:  Patient ID: Robert Guzman, male    DOB: October 20, 1970  Age: 48 y.o. MRN: [id number]  CC: Pain (left side painful when laydown, hard to breath/1 day/ )  Back Pain This is a new problem. The current episode started yesterday. The problem occurs constantly. The problem is unchanged. The pain is present in the thoracic spine. Quality: sharp pain in supine position. Worse during: worse in supine position. The symptoms are aggravated by lying down. Pertinent negatives include no abdominal pain, chest pain, paresis, paresthesias, tingling, weakness or weight loss. He has tried NSAIDs for the symptoms. The treatment provided moderate relief.  states he had similar pain on right side 2weeks ago. He was evaluated in ED. Found herniated thoracic disc. Also found small right side pleural effusion.  He denies any recent lifting or pushing or fall to trigger left side pain. Denies any upper respiratory symptoms or diaphoresis. No recent travel or surgical procedure which required immobilization. Last CXR and labs (CBC,HIV) done 12/31/18 (no acute finding)  Reviewed past Medical, Social and Family history today.  No outpatient medications prior to visit.   No facility-administered medications prior to visit.    ROS See HPI  Objective:  BP 130/86    Pulse 83    Temp 98.1 F (36.7 C) (Tympanic)    Ht 5' 9.5" (1.765 m)    Wt 181 lb (82.1 kg)    SpO2 97%    BMI 26.35 kg/m   BP Readings from Last 3 Encounters:  01/07/19 130/86  12/31/18 126/70  12/25/18 135/84    Wt Readings from Last 3 Encounters:  01/07/19 181 lb (82.1 kg)  12/31/18 183 lb 3.2 oz (83.1 kg)  12/24/18 180 lb (81.6 kg)    Physical Exam Vitals signs reviewed.  Constitutional:      Appearance: Normal appearance.  Neck:     Musculoskeletal: Normal range of motion and neck supple.  Cardiovascular:     Rate and Rhythm: Normal rate and regular rhythm.     Pulses: Normal pulses.     Heart sounds: Normal heart  sounds. No friction rub.  Pulmonary:     Effort: Pulmonary effort is normal.     Breath sounds: Normal breath sounds. No stridor.  Chest:     Chest wall: No tenderness.  Musculoskeletal: Normal range of motion.        General: No tenderness.  Skin:    Findings: No erythema or rash.  Neurological:     Mental Status: He is alert and oriented to person, place, and time.    Lab Results  Component Value Date   WBC 5.9 12/31/2018   HGB 15.5 12/31/2018   HCT 45.2 12/31/2018   PLT 257.0 12/31/2018   GLUCOSE 93 12/31/2018   CHOL 208 (H) 12/31/2018   TRIG 119.0 12/31/2018   HDL 36.50 (L) 12/31/2018   LDLCALC 147 (H) 12/31/2018   ALT 64 (H) 12/31/2018   AST 26 12/31/2018   NA 140 12/31/2018   K 4.1 12/31/2018   CL 102 12/31/2018   CREATININE 1.03 12/31/2018   BUN 10 12/31/2018   CO2 27 12/31/2018   PSA 0.51 12/31/2018    Mr Thoracic Spine W Wo Contrast  Result Date: 12/25/2018 CLINICAL DATA:  Initial evaluation for acute mid right-sided back pain. EXAM: MRI THORACIC WITHOUT AND WITH CONTRAST TECHNIQUE: Multiplanar and multiecho pulse sequences of the thoracic spine were obtained without and with intravenous contrast. CONTRAST:  7 cc of Gadavist. COMPARISON:  None  available. FINDINGS: MRI THORACIC SPINE FINDINGS Alignment: Vertebral bodies normally aligned with preservation of the normal thoracic kyphosis. No listhesis or subluxation. Vertebrae: Vertebral body height maintained without evidence for acute or chronic fracture. Few scattered chronic endplate Schmorl's nodes present within the mid and lower thoracic spine, most notable about the T11-12 interspace. Underlying bone marrow signal intensity within normal limits. No discrete or worrisome osseous lesions. No abnormal marrow edema or enhancement. Cord: Signal intensity within the thoracic spinal cord is normal. Normal cord caliber morphology. Paraspinal and other soft tissues: Paraspinous soft tissues demonstrate no acute finding.  Small layering right pleural effusion noted. Visualized visceral structures within normal limits. Disc levels: T1-2:  Unremarkable. T2-3: Unremarkable. T3-4:  Unremarkable. T4-5:  Negative interspace.  Mild facet hypertrophy.  No stenosis. T5-6:  Negative interspace.  Mild facet hypertrophy.  No stenosis. T6-7: Central disc protrusion indents the ventral thecal sac, flattening the ventral thoracic cord (series 24, image 20). Slight superior and inferior migration of disc material noted. Mild facet hypertrophy. No significant spinal stenosis. Foramina remain patent. T7-8:  Negative interspace.  Mild facet hypertrophy.  No stenosis. T8-9: Right paracentral disc protrusion indents the right ventral thecal sac (series 24, image 25). Mild flattening of the ventral right cord without cord signal changes. Mild facet hypertrophy. No significant stenosis. T9-10: Negative interspace.  Mild facet hypertrophy.  No stenosis. T10-11: Chronic intervertebral disc space narrowing. Small central disc protrusion indents the ventral thecal sac (series 24, image 32). No significant cord deformity or stenosis. Mild facet hypertrophy. T11-12: Mild right eccentric disc bulge. Mild facet hypertrophy. No stenosis. T12-L1:  Negative interspace.  Mild facet hypertrophy.  No stenosis. IMPRESSION: 1. No acute abnormality within the thoracic spine. 2. Multilevel small to moderate central and right paracentral disc protrusions at T6-7, T8-9, and T10-11 as above, which could contribute to mid and right-sided back pain. No significant stenosis. 3. Small right pleural effusion. Electronically Signed   By: Jeannine Boga M.D.   On: 12/25/2018 02:16   Ct Renal Stone Study  Result Date: 12/25/2018 CLINICAL DATA:  Flank pain, stone disease suspected EXAM: CT ABDOMEN AND PELVIS WITHOUT CONTRAST TECHNIQUE: Multidetector CT imaging of the abdomen and pelvis was performed following the standard protocol without IV contrast. COMPARISON:  None.  FINDINGS: Lower chest: Basilar atelectasis. Lung bases are clear. Normal heart size. No pericardial effusion. Hepatobiliary: No focal liver abnormality is seen. The gallbladder is not visualized, correlate with surgical history. No biliary ductal dilatation or calcified intraductal gallstones. Pancreas: Unremarkable. No pancreatic ductal dilatation or surrounding inflammatory changes. Spleen: Normal in size without focal abnormality. Adrenals/Urinary Tract: Adrenal glands are unremarkable. Kidneys are normal, without renal calculi, focal lesion, or hydronephrosis. Bladder is unremarkable. Stomach/Bowel: Distal esophagus, stomach and duodenal sweep are unremarkable. No bowel wall thickening or dilatation. No evidence of obstruction. Patient appears to be post right hemicolectomy with a patent ileocolic anastomosis in the right lower quadrant. Vascular/Lymphatic: The aorta is normal caliber. Small amount of calcified atheromatous plaque in the right common iliac. No suspicious or enlarged lymph nodes in the included lymphatic chains. Reproductive: The prostate and seminal vesicles are unremarkable. Other: No abdominopelvic free fluid or free gas. No bowel containing hernias. Musculoskeletal: Multilevel degenerative changes are present in the imaged portions of the spine. No acute osseous abnormality or suspicious osseous lesion. IMPRESSION: No acute intra-abdominal process. Specifically, no urolithiasis or hydronephrosis. Nonvisualization of the gallbladder, correlate with surgical history. Appearance of prior right hemicolectomy with patent ileocolic anastomosis in the right  lower quadrant. Electronically Signed   By: Lovena Le M.D.   On: 12/25/2018 00:06    Assessment & Plan:   Fatima was seen today for pain.  Diagnoses and all orders for this visit:  Acute left-sided thoracic back pain -     Cancel: DG Chest 2 View -     DG Chest 2 View; Future   Orlando Penner. [facility] does not currently have  medications on file.  No orders of the defined types were placed in this encounter.   Problem List Items Addressed This Visit    None    Visit Diagnoses    Acute left-sided thoracic back pain    -  Primary   Relevant Orders   DG Chest 2 View      Follow-up: Return if symptoms worsen or fail to improve.  Wilfred Lacy, NP

## 2019-01-07 NOTE — Telephone Encounter (Signed)
Can you try and contact pt to schedule an appt? Triage said they tried the scheduling line twice while they had the pt on the phone.

## 2019-01-07 NOTE — Telephone Encounter (Addendum)
Pt called stating he has difficulty breathing on the right side; he saw Dr Deborra Medina 12/31/2018; he also saw a spine MD who told him that he had pleurisy; on 01/06/2019 he developed the same sensation on the left; the pt says that his level of discomfort is 5-6 when sitting, and it is better when he stands up; recommendations made per nurse triage protocol; he verbalized understanding; the pt sees Dr Deborra Medina, Audrie Lia; attempted to contact scheduling twice, but no answer; the pt can be contacted at [phone], and a message can be left; will route to office for scheduling.

## 2019-01-07 NOTE — Patient Instructions (Addendum)
Go to lab for repeat CXR. You will be contacted with results.  You may use the following OTC medications: ibuprofen 400-600mg  every 8hrs (with food) as needed or tylenol 500-1000mg  every 6hrs as needed for pain.

## 2019-01-07 NOTE — Telephone Encounter (Signed)
  Reason for Disposition . [1] MILD difficulty breathing (e.g., minimal/no SOB at rest, SOB with walking, pulse <100) AND [2] NEW-onset or WORSE than normal  Answer Assessment - Initial Assessment Questions 1. RESPIRATORY STATUS: "Describe your breathing?" (e.g., wheezing, shortness of breath, unable to speak, severe coughing)      Pain in left chest when taking a deep breath ("like hitting a nerve") 2. ONSET: "When did this breathing problem begin?"     01/06/2019 3. PATTERN "Does the difficult breathing come and go, or has it been constant since it started?"      constant 4. SEVERITY: "How bad is your breathing?" (e.g., mild, moderate, severe)    - MILD: No SOB at rest, mild SOB with walking, speaks normally in sentences, can lay down, no retractions, pulse < 100.    - MODERATE: SOB at rest, SOB with minimal exertion and prefers to sit, cannot lie down flat, speaks in phrases, mild retractions, audible wheezing, pulse 100-120.    - SEVERE: Very SOB at rest, speaks in single words, struggling to breathe, sitting hunched forward, retractions, pulse > 120      5-6 out of 10 5. RECURRENT SYMPTOM: "Have you had difficulty breathing before?" If so, ask: "When was the last time?" and "What happened that time?"      Pleurisy 15 years ago 6. CARDIAC HISTORY: "Do you have any history of heart disease?" (e.g., heart attack, angina, bypass surgery, angioplasty)      no 7. LUNG HISTORY: "Do you have any history of lung disease?"  (e.g., pulmonary embolus, asthma, emphysema)     History of pleurisy; given antibiotics 8. CAUSE: "What do you think is causing the breathing problem?"      ? pleurisy 9. OTHER SYMPTOMS: "Do you have any other symptoms? (e.g., dizziness, runny nose, cough, chest pain, fever)     occ non-productive cough 10. PREGNANCY: "Is there any chance you are pregnant?" "When was your last menstrual period?"       n/a 11. 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

## 2019-01-09 NOTE — Telephone Encounter (Signed)
Spoke with pt.  He said that spine surgeon feels radiologist misread his MRI and he does not have a herniated disc.  He is already scheduled for follow CXR in our office next Friday to re evaluate pleural effusion.  ? Pleurisy?

## 2019-01-09 NOTE — Telephone Encounter (Signed)
Left VM for pt to return my call- gave him my cell phone number since I am out of the office today.

## 2019-01-13 ENCOUNTER — Ambulatory Visit: Payer: 59 | Admitting: Physical Therapy

## 2019-01-15 ENCOUNTER — Telehealth: Payer: Self-pay

## 2019-01-15 ENCOUNTER — Encounter: Payer: 59 | Admitting: Physical Therapy

## 2019-01-15 NOTE — Telephone Encounter (Signed)

## 2019-01-16 ENCOUNTER — Other Ambulatory Visit: Payer: Self-pay

## 2019-01-16 ENCOUNTER — Other Ambulatory Visit: Payer: 59

## 2019-01-16 ENCOUNTER — Ambulatory Visit (INDEPENDENT_AMBULATORY_CARE_PROVIDER_SITE_OTHER): Payer: 59

## 2019-01-16 DIAGNOSIS — M546 Pain in thoracic spine: Secondary | ICD-10-CM | POA: Diagnosis not present

## 2019-01-20 ENCOUNTER — Encounter: Payer: 59 | Admitting: Physical Therapy

## 2019-01-22 ENCOUNTER — Encounter: Payer: 59 | Admitting: Physical Therapy

## 2019-01-27 ENCOUNTER — Encounter: Payer: 59 | Admitting: Physical Therapy

## 2019-01-29 ENCOUNTER — Encounter: Payer: 59 | Admitting: Physical Therapy

## 2019-02-03 ENCOUNTER — Encounter: Payer: 59 | Admitting: Physical Therapy

## 2019-02-05 ENCOUNTER — Encounter: Payer: 59 | Admitting: Physical Therapy

## 2019-02-10 ENCOUNTER — Encounter: Payer: 59 | Admitting: Physical Therapy

## 2019-02-12 ENCOUNTER — Encounter: Payer: 59 | Admitting: Physical Therapy

## 2019-02-17 ENCOUNTER — Encounter: Payer: 59 | Admitting: Physical Therapy

## 2019-02-19 ENCOUNTER — Encounter: Payer: 59 | Admitting: Physical Therapy

## 2019-12-06 ENCOUNTER — Other Ambulatory Visit: Payer: Self-pay

## 2019-12-06 DIAGNOSIS — M5020 Other cervical disc displacement, unspecified cervical region: Secondary | ICD-10-CM | POA: Insufficient documentation

## 2019-12-06 DIAGNOSIS — Z87891 Personal history of nicotine dependence: Secondary | ICD-10-CM | POA: Diagnosis not present

## 2019-12-06 DIAGNOSIS — M542 Cervicalgia: Secondary | ICD-10-CM | POA: Diagnosis present

## 2019-12-06 MED ORDER — OXYCODONE-ACETAMINOPHEN 5-325 MG PO TABS
1.0000 | ORAL_TABLET | Freq: Once | ORAL | Status: AC
  Administered 2019-12-06: 1 via ORAL
  Filled 2019-12-06: qty 1

## 2019-12-06 NOTE — ED Triage Notes (Signed)
Patient to ED for right sided neck pain. Was seen at [facility] for same and given muscle relaxers, NSAIDs which have not helped. Patient here tonight for relief so he can get some sleep. Unable to turn his head to either side due to pain. States when he lays down it causes his head to pound.

## 2019-12-07 ENCOUNTER — Emergency Department
Admission: EM | Admit: 2019-12-07 | Discharge: 2019-12-07 | Disposition: A | Discharge status: Home / Self Care | Payer: 59 | Attending: Emergency Medicine | Admitting: Emergency Medicine

## 2019-12-07 ENCOUNTER — Emergency Department: Payer: 59

## 2019-12-07 ENCOUNTER — Telehealth: Payer: Self-pay

## 2019-12-07 DIAGNOSIS — M5030 Other cervical disc degeneration, unspecified cervical region: Secondary | ICD-10-CM

## 2019-12-07 DIAGNOSIS — M5020 Other cervical disc displacement, unspecified cervical region: Secondary | ICD-10-CM

## 2019-12-07 MED ORDER — TIZANIDINE HCL 4 MG PO TABS: 4.0000 mg | ORAL_TABLET | Freq: Three times a day (TID) | ORAL | 0 refills | Status: DC

## 2019-12-07 MED ORDER — OXYCODONE-ACETAMINOPHEN 10-325 MG PO TABS: 1.0000 | ORAL_TABLET | Freq: Four times a day (QID) | ORAL | 0 refills | Status: DC | PRN

## 2019-12-07 MED ORDER — PREDNISONE 10 MG (21) PO TBPK: ORAL_TABLET | ORAL | 0 refills | Status: DC

## 2019-12-07 NOTE — ED Notes (Signed)
See triage note  Presents with pain to neck  Was seen and placed on muscle relaxers and Naprosyn   States he is not able to get any rest  Having some diff turning his head  Denies any injury  No fever

## 2019-12-07 NOTE — ED Provider Notes (Signed)
[facility] Emergency Department Provider Note ____________________________________________  Time seen: Approximately 7:40 AM  I have reviewed the triage vital signs and the nursing notes.   HISTORY  Chief Complaint Neck Pain    HPI Robert Guzman is a 49 y.o. male who presents to the emergency department for evaluation and treatment of right side neck pain.   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  He has had similar pain a few years ago after riding on a roller coaster.  He was evaluated at urgent care where an x-ray of his neck was taken and he was prescribed Flexeril and Naprosyn.  No relief with meds.  Past Medical History:  Diagnosis Date  . Colon cancer ([place]) 2006   COLON/ chemo  . Encounter for blood transfusion    for colon cancer  . Genetic testing 2007   Negative     Patient Active Problem List   Diagnosis Date Noted  . Situational anxiety 12/31/2018  . Encounter for examination following treatment at hospital 12/29/2018  . 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 Leukocytosis 12/29/2018  . Acute renal insufficiency 12/29/2018  . Pleural effusion on right 12/29/2018  . HLD (hyperlipidemia) 01/09/2016  . History of colon cancer 06/02/2015  . History of colonic polyps 06/02/2015  . Visit for well man health check 01/05/2015    Past Surgical History:  Procedure Laterality Date  . COLON SURGERY  Nov 2006  . COLONOSCOPY  05-28-12  . COLONOSCOPY WITH PROPOFOL N/A 07/13/2015   Procedure: COLONOSCOPY WITH PROPOFOL;  Surgeon: Rochelle Bellow, MD;  [facility];  Service: Endoscopy;  Laterality: N/A;  . TOOTH EXTRACTION      Prior to Admission medications   Medication Sig Start Date End Date Taking? Authorizing Provider  oxyCODONE-acetaminophen (PERCOCET) 10-325 MG tablet Take 1 tablet by  mouth every 6 (six) hours as needed for pain. 12/07/19 12/06/20  Nhung Danko, Dessa Phi, FNP  predniSONE (STERAPRED UNI-PAK 21 TAB) 10 MG (21) TBPK tablet Take 6 tablets on the first day and decrease by 1 tablet each day until finished. 12/07/19   Kaileena Obi, Johnette Abraham B, FNP  tiZANidine (ZANAFLEX) 4 MG tablet Take 1 tablet (4 mg total) by mouth 3 (three) times daily. 12/07/19   Victorino Dike, FNP    Allergies Patient has no known allergies.  Family History  Problem Relation Age of Onset  . Cancer Mother   . Diabetes Father   . Hypertension Father     Social History Social History   Tobacco Use  . Smoking status: Former Research scientist (life sciences)  . Smokeless tobacco: Never Used  Substance Use Topics  . Alcohol use: No  . Drug use: No    Review of Systems Constitutional: Negative for fever. Cardiovascular: Negative for chest pain. Respiratory: Negative for shortness of breath. Musculoskeletal: Positive for right side neck pain Skin: Negative for open wounds/lesions.  Neurological: Negative for decrease in sensation  ____________________________________________   PHYSICAL EXAM:  VITAL SIGNS: ED Triage Vitals  Enc Vitals Group     BP 12/06/19 2332 (!) 143/81     Pulse Rate 12/06/19 2332 62     Resp 12/06/19 2332 16     Temp 12/06/19 2332 98.8 F (37.1 C)     Temp Source 12/06/19 2332 Oral     SpO2 12/06/19 2332 99 %  Weight 12/06/19 2333 180 lb (81.6 kg)     Height 12/06/19 2333 5\' 9"  (1.753 m)     Head Circumference --      Peak Flow --      Pain Score 12/06/19 2348 7     Pain Loc --      Pain Edu? --      Excl. in [place]? --     Constitutional: Alert and oriented. Well appearing and in no acute distress. Eyes: Conjunctivae are clear without discharge or drainage Head: Atraumatic Neck: Focal area of tenderness over the right lateral neck just below skll Respiratory: No cough. Respirations are even and unlabored. Musculoskeletal: Decreased ROM of head and neck secondary to pain.   Neurologic: Awake, alert, oriented.  Skin: No open wounds/lesions.  Psychiatric: Affect and behavior are appropriate.  ____________________________________________   LABS (all labs ordered are listed, but only abnormal results are displayed)  Labs Reviewed - No data to display ____________________________________________  RADIOLOGY  MRI of the cervical spine shows disc protrusion at C5-6 with resultant mild canal and moderate right worse than left C6 foraminal stenosis.  Shallow left paracentral to foraminal disc protrusion at C6-7 with resultant mild canal and moderate left C7 foraminal stenosis.  Disc bulge with uncovertebral hypertrophy at C4-5 with resultant mild right C5 foraminal stenosis.  I, Sherrie George, personally viewed and evaluated these images (plain radiographs) as part of my medical decision making, as well as reviewing the written report by the radiologist.  MR Cervical Spine Wo Contrast  Result Date: 12/07/2019 CLINICAL DATA:  Initial evaluation for acute cervical radiculopathy, neck pain. EXAM: MRI CERVICAL SPINE WITHOUT CONTRAST TECHNIQUE: Multiplanar, multisequence MR imaging of the cervical spine was performed. No intravenous contrast was administered. COMPARISON:  Prior radiograph from 12/04/2019. FINDINGS: Alignment: Straightening of the normal cervical lordosis. No listhesis. Vertebrae: Vertebral body height maintained without acute or chronic fracture. Bone marrow signal intensity within normal limits. No discrete or worrisome osseous lesion. No abnormal marrow edema. Cord: Signal intensity within the cervical spinal cord is normal. Posterior Fossa, vertebral arteries, paraspinal tissues: Visualized brain and posterior fossa within normal limits. Craniocervical junction normal. Paraspinous and prevertebral soft tissues within normal limits. Normal intravascular flow voids seen within the vertebral arteries bilaterally. Disc levels: C2-C3: Normal interspace. Mild  bilateral facet hypertrophy. No significant spinal stenosis. Foramina remain patent. C3-C4: Mild right-sided uncovertebral hypertrophy without significant disc bulge. Mild bilateral facet degeneration. No spinal stenosis. Foramina remain patent. C4-C5: Mild disc bulge with uncovertebral hypertrophy. No significant spinal stenosis. Mild right C5 foraminal narrowing. No significant left foraminal stenosis. C5-C6: Broad-based and somewhat lobulated posterior disc protrusion, most pronounced on the right (series 9, image 17). Slight lateral extension towards the right neural foramen. Secondary flattening and effacement of the ventral thecal sac with mild flattening of the right hemi cord. Mild spinal stenosis without cord signal changes. Superimposed uncovertebral hypertrophy with resultant moderate right worse than left C6 foraminal stenosis. C6-C7: Shallow left paracentral to foraminal disc protrusion indents the left ventral thecal sac (series 9, image 22). Resultant mild spinal stenosis with mild flattening of the left ventral cord. No cord signal changes. Moderate left C7 foraminal narrowing. No significant right foraminal stenosis. C7-T1:  Normal interspace.  Mild facet hypertrophy.  No stenosis. Visualized upper thoracic spine demonstrates no significant finding. IMPRESSION: 1. Broad-based and lobulated posterior disc protrusion at C5-6 with resultant mild canal and moderate right worse than left C6 foraminal stenosis. 2. Shallow left paracentral to foraminal disc protrusion  at C6-7 with resultant mild canal and moderate left C7 foraminal stenosis. 3. Mild disc bulge with uncovertebral hypertrophy at C4-5 with resultant mild right C5 foraminal stenosis. Electronically Signed   By: Jeannine Boga M.D.   On: 12/07/2019 01:17   ____________________________________________   PROCEDURES  Procedures  ____________________________________________   INITIAL IMPRESSION / ASSESSMENT AND PLAN / ED  COURSE  Robert Guzman is a 49 y.o. who presents to the emergency department for treatment and evaluation of right-sided neck pain.  See HPI for further details.  While awaiting ER room assignment, MRI was performed with results as documented above.  Patient will be treated with steroid, Zanaflex, and Percocet.  He was advised to follow-up with his primary care provider and will also be given a referral to neurosurgery.  He was encouraged to call and schedule an appointment.  He will also be given a work note for the next couple of days.  He was advised to return to the emergency department for symptoms change or worsen if he is unable to see primary care or the specialist.  Medications  oxyCODONE-acetaminophen (PERCOCET/ROXICET) 5-325 MG per tablet 1 tablet (1 tablet Oral Given 12/06/19 2356)    Pertinent labs & imaging results that were available during my care of the patient were reviewed by me and considered in my medical decision making (see chart for details).   _________________________________________   FINAL CLINICAL IMPRESSION(S) / ED DIAGNOSES  Final diagnoses:  Bulge of cervical disc without myelopathy    ED Discharge Orders         Ordered    predniSONE (STERAPRED UNI-PAK 21 TAB) 10 MG (21) TBPK tablet     Discontinue  Reprint     12/07/19 0758    oxyCODONE-acetaminophen (PERCOCET) 10-325 MG tablet  Every 6 hours PRN     Discontinue  Reprint     12/07/19 0758    tiZANidine (ZANAFLEX) 4 MG tablet  3 times daily     Discontinue  Reprint     12/07/19 0758           If controlled substance prescribed during this visit, 12 month history viewed on the [place] prior to issuing an initial prescription for Schedule II or III opiod.   Victorino Dike, FNP 12/07/19 1428    Lavonia Drafts, MD 12/07/19 1456

## 2019-12-07 NOTE — Progress Notes (Deleted)
     Established patient visit   Patient: Robert Guzman   DOB: [birth year]   49 y.o. Male  MRN: [id number] Visit Date: 12/08/2019  Today's healthcare provider: Wilhemena Durie, MD   No chief complaint on file.  Subjective    HPI  Follow up ER visit  Patient was seen in ER for Neck Pain on 12/07/2019. He was treated for Neck pain. Treatment for this included; see notes in chart. He reports {DESC; EXCELLENT/GOOD/FAIR:19992} compliance with treatment. He reports this condition is {improved/worse/unchanged:3041574}.  -----------------------------------------------------------------------------------------    {Show patient history (optional):23778::" "}   Medications: Outpatient Medications Prior to Visit  Medication Sig  . oxyCODONE-acetaminophen (PERCOCET) 10-325 MG tablet Take 1 tablet by mouth every 6 (six) hours as needed for pain.  . predniSONE (STERAPRED UNI-PAK 21 TAB) 10 MG (21) TBPK tablet Take 6 tablets on the first day and decrease by 1 tablet each day until finished.  Marland Kitchen tiZANidine (ZANAFLEX) 4 MG tablet Take 1 tablet (4 mg total) by mouth 3 (three) times daily.   No facility-administered medications prior to visit.    Review of Systems  Constitutional: Negative for appetite change, chills and fever.  Respiratory: Negative for chest tightness, shortness of breath and wheezing.   Cardiovascular: Negative for chest pain and palpitations.  Gastrointestinal: Negative for abdominal pain, nausea and vomiting.    {Heme  Chem  Endocrine  Serology  Results Review (optional):23779::" "}  Objective    There were no vitals taken for this visit. {Show previous vital signs (optional):23777::" "}  Physical Exam  ***  No results found for any visits on 12/08/19.  Assessment & Plan     ***  No follow-ups on file.      {provider attestation***:1}   Wilhemena Durie, MD  [facility] [phone] (phone) [phone] (fax)  [place]

## 2019-12-07 NOTE — Telephone Encounter (Signed)
Lisette Abu Copied from [place] [phone]. Topic: General - Other >> Dec 07, 2019  2:47 PM Oneta Rack wrote: Patient is scheduled with Dr. Rosanna Randy for 8/17 and spouse will accompany patient at the time of appointment due to patient medical condition.

## 2019-12-08 ENCOUNTER — Ambulatory Visit (INDEPENDENT_AMBULATORY_CARE_PROVIDER_SITE_OTHER): Payer: 59 | Admitting: Family Medicine

## 2019-12-08 ENCOUNTER — Other Ambulatory Visit: Payer: Self-pay

## 2019-12-08 ENCOUNTER — Ambulatory Visit: Payer: Self-pay | Admitting: Family Medicine

## 2019-12-08 ENCOUNTER — Encounter: Payer: Self-pay | Admitting: Family Medicine

## 2019-12-08 VITALS — BP 133/84 | HR 83 | Temp 98.4°F | Ht 69.0 in | Wt 188.4 lb

## 2019-12-08 DIAGNOSIS — Z85038 Personal history of other malignant neoplasm of large intestine: Secondary | ICD-10-CM

## 2019-12-08 DIAGNOSIS — G243 Spasmodic torticollis: Secondary | ICD-10-CM | POA: Diagnosis not present

## 2019-12-08 DIAGNOSIS — M5090 Cervical disc disorder, unspecified, unspecified cervical region: Secondary | ICD-10-CM | POA: Diagnosis not present

## 2019-12-08 NOTE — Progress Notes (Signed)
Trena Platt Cummings,acting as a scribe for Wilhemena Durie, MD.,have documented all relevant documentation on the behalf of Wilhemena Durie, MD,as directed by  Wilhemena Durie, MD while in the presence of Wilhemena Durie, MD.  New patient visit   Patient: Robert Guzman   DOB: July 11, 1970   49 y.o. Male  MRN: [id number] Visit Date: 12/08/2019  Today's healthcare provider: Wilhemena Durie, MD   Chief Complaint  Patient presents with  . New Patient (Initial Visit)   Subjective    Robert Guzman is a 49 y.o. male who presents today as a new patient to establish care.  He is accompanied by his wife.  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  HPI  He is having no radicular pain.  Just neck pain with rotational movement of the neck. Patient is having neck stiffness, patient says it started last Wednesday.  Patient went to [facility] on Sunday to be seen for this problem. See hospital notes.  Patient is participating in regular exercise.  Patient is eating a general diet.   Past Medical History:  Diagnosis Date  . Colon cancer ([place]) 2006   COLON/ chemo  . Encounter for blood transfusion    for colon cancer  . Genetic testing 2007   Negative    Past Surgical History:  Procedure Laterality Date  . COLON SURGERY  Nov 2006  . COLONOSCOPY  05-28-12  . COLONOSCOPY WITH PROPOFOL N/A 07/13/2015   Procedure: COLONOSCOPY WITH PROPOFOL;  Surgeon: Wasil Bellow, MD;  Location: [facility] ENDOSCOPY;  Service: Endoscopy;  Laterality: N/A;  . TOOTH EXTRACTION     Family Status  Relation Name Status  . Mother  Alive  . Father  Alive  . Sister  Alive  . Brother  Alive   Family History  Problem Relation Age of Onset  . Cancer Mother   . Diabetes Father   . Hypertension Father   . Heart attack Father    Social History   Socioeconomic History  . Marital status: Married    Spouse name: Not on file  . Number of children: Not on file  . Years of education: Not on  file  . Highest education level: Not on file  Occupational History  . Not on file  Tobacco Use  . Smoking status: Former Research scientist (life sciences)  . Smokeless tobacco: Never Used  Substance and Sexual Activity  . Alcohol use: Yes    Alcohol/week: 1.0 standard drink    Types: 1 Cans of beer per week  . Drug use: No  . Sexual activity: Yes  Other Topics Concern  . Not on file  Social History Narrative   Engineer, structural   Married- 2 kids- 9 y.o. and 72 y.o.   Social Determinants of Health   Financial Resource Strain:   . Difficulty of Paying Living Expenses:   Food Insecurity:   . Worried About Charity fundraiser in the Last Year:   . Arboriculturist in the Last Year:   Transportation Needs:   . Film/video editor (Medical):   Marland Kitchen Lack of Transportation (Non-Medical):   Physical Activity:   . Days of Exercise per Week:   . Minutes of Exercise per Session:   Stress:   . Feeling of Stress :   Social Connections:   . Frequency of Communication with Friends and Family:   . Frequency of Social Gatherings with Friends and Family:   . Attends  Religious Services:   . Active Member of Clubs or Organizations:   . Attends Archivist Meetings:   Marland Kitchen Marital Status:    Outpatient Medications Prior to Visit  Medication Sig  . predniSONE (STERAPRED UNI-PAK 21 TAB) 10 MG (21) TBPK tablet Take 6 tablets on the first day and decrease by 1 tablet each day until finished.  Marland Kitchen tiZANidine (ZANAFLEX) 4 MG tablet Take 1 tablet (4 mg total) by mouth 3 (three) times daily.  Marland Kitchen oxyCODONE-acetaminophen (PERCOCET) 10-325 MG tablet Take 1 tablet by mouth every 6 (six) hours as needed for pain. (Patient not taking: Reported on 12/08/2019)   No facility-administered medications prior to visit.   No Known Allergies  Immunization History  Administered Date(s) Administered  . Influenza,inj,Quad PF,6+ Mos 01/09/2017, 12/31/2018  . Tdap 12/31/2018    Health Maintenance  Topic Date Due  . Hepatitis C  Screening  Never done  . COVID-19 Vaccine (1) Never done  . INFLUENZA VACCINE  11/22/2019  . TETANUS/TDAP  12/30/2028  . HIV Screening  Completed    Patient Care Team: Jerrol Banana., MD as PCP - General (Family Medicine) Bary Castilla Forest Gleason, MD (General Surgery) Jerrol Banana., MD (Family Medicine)  Review of Systems  Constitutional: Negative.   HENT: Negative.   Eyes: Negative.   Respiratory: Negative.   Cardiovascular: Negative.   Gastrointestinal: Negative.   Endocrine: Negative.   Genitourinary: Negative.   Musculoskeletal: Positive for neck pain and neck stiffness.  Skin: Negative.   Allergic/Immunologic: Negative.   Neurological: Positive for numbness.  Hematological: Negative.   Psychiatric/Behavioral: Negative.        Objective    BP 133/84 (BP Location: Left Arm, Patient Position: Sitting, Cuff Size: Normal)   Pulse 83   Temp 98.4 F (36.9 C) (Oral)   Ht 5\' 9"  (1.753 m)   Wt 188 lb 6.4 oz (85.5 kg)   BMI 27.82 kg/m  Physical Exam Vitals reviewed.  Constitutional:      Appearance: Normal appearance.  HENT:     Head: Normocephalic and atraumatic.     Right Ear: External ear normal.     Left Ear: External ear normal.  Eyes:     General: No scleral icterus.    Conjunctiva/sclera: Conjunctivae normal.  Cardiovascular:     Rate and Rhythm: Normal rate and regular rhythm.     Pulses: Normal pulses.     Heart sounds: Normal heart sounds.  Pulmonary:     Effort: Pulmonary effort is normal.     Breath sounds: Normal breath sounds.  Musculoskeletal:     Right lower leg: No edema.     Left lower leg: No edema.  Skin:    General: Skin is warm and dry.  Neurological:     General: No focal deficit present.     Mental Status: He is alert and oriented to person, place, and time.     Comments: Normal exam of upper extremities with strength  Psychiatric:        Mood and Affect: Mood normal.        Behavior: Behavior normal.        Thought  Content: Thought content normal.        Judgment: Judgment normal.      Depression Screen PHQ 2/9 Scores 12/08/2019 12/31/2018 01/09/2017  PHQ - 2 Score 2 1 0  PHQ- 9 Score 7 3 -   No results found for any visits on 12/08/19.  Assessment & Plan  1. Cervical disc disease  Refer to Dr. Arnoldo Morale from neurosurgery for baseline for this patient with degenerative disc disease but I do not think he has a surgical problem at present. - Ambulatory referral to Neurosurgery  2. Torticollis, spasmodic Continue supportive therapy.  I think this is improving on its own.  3. History of colon cancer in adulthood Followed by GI.  See him back for his complete physical in the next few months.  Return in about 4 months (around 04/08/2020).        Keefe Zawistowski Cranford Mon, MD  [facility] [phone] (phone) [phone] (fax)  [place]

## 2020-01-05 DIAGNOSIS — M5020 Other cervical disc displacement, unspecified cervical region: Secondary | ICD-10-CM | POA: Insufficient documentation

## 2020-01-05 DIAGNOSIS — M542 Cervicalgia: Secondary | ICD-10-CM | POA: Insufficient documentation

## 2020-03-03 ENCOUNTER — Emergency Department: Payer: 59

## 2020-03-03 ENCOUNTER — Ambulatory Visit: Payer: Self-pay

## 2020-03-03 ENCOUNTER — Other Ambulatory Visit: Payer: Self-pay

## 2020-03-03 ENCOUNTER — Emergency Department
Admission: EM | Admit: 2020-03-03 | Discharge: 2020-03-03 | Disposition: A | Discharge status: Home / Self Care | Payer: 59 | Attending: Emergency Medicine | Admitting: Emergency Medicine

## 2020-03-03 DIAGNOSIS — Z20822 Contact with and (suspected) exposure to covid-19: Secondary | ICD-10-CM | POA: Insufficient documentation

## 2020-03-03 DIAGNOSIS — J90 Pleural effusion, not elsewhere classified: Secondary | ICD-10-CM

## 2020-03-03 DIAGNOSIS — Z87891 Personal history of nicotine dependence: Secondary | ICD-10-CM | POA: Insufficient documentation

## 2020-03-03 DIAGNOSIS — R079 Chest pain, unspecified: Secondary | ICD-10-CM | POA: Diagnosis present

## 2020-03-03 DIAGNOSIS — Z85038 Personal history of other malignant neoplasm of large intestine: Secondary | ICD-10-CM | POA: Diagnosis not present

## 2020-03-03 DIAGNOSIS — R091 Pleurisy: Secondary | ICD-10-CM | POA: Insufficient documentation

## 2020-03-03 LAB — BASIC METABOLIC PANEL
Anion gap: 8 (ref 5–15)
BUN: 13 mg/dL (ref 6–20)
CO2: 27 mmol/L (ref 22–32)
Calcium: 9.1 mg/dL (ref 8.9–10.3)
Chloride: 103 mmol/L (ref 98–111)
Creatinine, Ser: 1.27 mg/dL — ABNORMAL HIGH (ref 0.61–1.24)
GFR, Estimated: >60 mL/min (ref >60)
Glucose, Bld: 150 mg/dL — ABNORMAL HIGH (ref 70–99)
Potassium: 4.3 mmol/L (ref 3.5–5.1)
Sodium: 138 mmol/L (ref 135–145)

## 2020-03-03 LAB — CBC
HCT: 43.4 % (ref 39.0–52.0)
Hemoglobin: 15.1 g/dL (ref 13.0–17.0)
MCH: 30.9 pg (ref 26.0–34.0)
MCHC: 34.8 g/dL (ref 30.0–36.0)
MCV: 88.8 fL (ref 80.0–100.0)
Platelets: 174 10*3/uL (ref 150–400)
RBC: 4.89 MIL/uL (ref 4.22–5.81)
RDW: 13.9 % (ref 11.5–15.5)
WBC: 8.4 10*3/uL (ref 4.0–10.5)
nRBC: 0 % (ref 0.0–0.2)

## 2020-03-03 LAB — RESPIRATORY PANEL BY RT PCR (FLU A&B, COVID)
Influenza A by PCR: NEGATIVE
Influenza B by PCR: NEGATIVE
SARS Coronavirus 2 by RT PCR: NEGATIVE

## 2020-03-03 LAB — FIBRIN DERIVATIVES D-DIMER (ARMC ONLY): Fibrin derivatives D-dimer (ARMC): 765.64 ng/mL (FEU) — ABNORMAL HIGH (ref 0.00–499.00)

## 2020-03-03 LAB — TROPONIN I (HIGH SENSITIVITY)
Troponin I (High Sensitivity): <2 ng/L (ref <18)
Troponin I (High Sensitivity): 3 ng/L (ref <18)

## 2020-03-03 MED ORDER — IOHEXOL 350 MG/ML SOLN
75.0000 mL | Freq: Once | INTRAVENOUS | Status: AC | PRN
  Administered 2020-03-03: 75 mL via INTRAVENOUS

## 2020-03-03 NOTE — ED Provider Notes (Signed)
[facility] Emergency Department Provider Note   ____________________________________________   First MD Initiated Contact with Patient 03/03/20 [phone]     (approximate)  I have reviewed the triage vital signs and the nursing notes.   HISTORY  Chief Complaint Chest Pain    HPI A 49 year old patient presents for evaluation of chest pain. Initial onset of pain was more than 6 hours ago. The patient's chest pain is described as heaviness/pressure/tightness and is not worse with exertion. The patient's chest pain is not middle- or left-sided, is not well-localized, is not sharp and does not radiate to the arms/jaw/neck. The patient does not complain of nausea and denies diaphoresis. The patient has no history of stroke, has no history of peripheral artery disease, has not smoked in the past 90 days, denies any history of treated diabetes, has no relevant family history of coronary artery disease (first degree relative at less than age 34), is not hypertensive, has no history of hypercholesterolemia and does not have an elevated BMI (>=30).   Started last evening slight tightness or pressure feeling over the right side of the chest.  Slight worsening with deep breathing.  No shortness of breath.  Does not radiate.  No pain in the back or abdomen.  No pain in the neck or sides.  No left-sided chest pain.  No history of heart disease.  Distant history of colon cancer treated 10 years ago.  Does report that they noticed on some imaging studies about a year ago that he had a small amount of fluid on the right lung and followed up with his primary care doctor  No long trips or travel.  No leg swelling.  Does have a history of a previous DVT or blood clot that occurred while he was being treated for cancer 10 years ago but none since.  Past Medical History:  Diagnosis Date  . Colon cancer ([place]) 2006   COLON/ chemo  . Encounter for blood transfusion    for colon cancer  .  Genetic testing 2007   Negative     Patient Active Problem List   Diagnosis Date Noted  . Situational anxiety 12/31/2018  . Encounter for examination following treatment at hospital 12/29/2018  . 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 Leukocytosis 12/29/2018  . Acute renal insufficiency 12/29/2018  . Pleural effusion on right 12/29/2018  . HLD (hyperlipidemia) 01/09/2016  . History of colon cancer 06/02/2015  . History of colonic polyps 06/02/2015  . Visit for well man health check 01/05/2015    Past Surgical History:  Procedure Laterality Date  . COLON SURGERY  Nov 2006  . COLONOSCOPY  05-28-12  . COLONOSCOPY WITH PROPOFOL N/A 07/13/2015   Procedure: COLONOSCOPY WITH PROPOFOL;  Surgeon: Jedd Bellow, MD;  Location: [facility] ENDOSCOPY;  Service: Endoscopy;  Laterality: N/A;  . TOOTH EXTRACTION      Prior to Admission medications   Medication Sig Start Date End Date Taking? Authorizing Provider  oxyCODONE-acetaminophen (PERCOCET) 10-325 MG tablet Take 1 tablet by mouth every 6 (six) hours as needed for pain. Patient not taking: Reported on 12/08/2019 12/07/19 12/06/20  Victorino Dike, FNP  predniSONE (STERAPRED UNI-PAK 21 TAB) 10 MG (21) TBPK tablet Take 6 tablets on the first day and decrease by 1 tablet each day until finished. 12/07/19   Triplett, Johnette Abraham B, FNP  tiZANidine (ZANAFLEX) 4 MG tablet Take 1 tablet (4 mg total) by mouth 3 (three) times daily. 12/07/19   Triplett, Johnette Abraham  B, FNP    Allergies Patient has no known allergies.  Family History  Problem Relation Age of Onset  . Cancer Mother   . Diabetes Father   . Hypertension Father   . Heart attack Father     Social History Social History   Tobacco Use  . Smoking status: Former Research scientist (life sciences)  . Smokeless tobacco: Never Used  Substance Use Topics  . Alcohol use: Yes    Alcohol/week: 1.0 standard drink    Types: 1 Cans of beer per week  . Drug use: No    Review of Systems Constitutional: No  fever/chills Eyes: No visual changes. ENT: No sore throat. Cardiovascular: See HPI Respiratory: Denies shortness of breath. Gastrointestinal: No abdominal pain.   Genitourinary: Negative for dysuria. Musculoskeletal: Negative for back pain. Skin: Negative for rash. Neurological: Negative for headaches.    ____________________________________________   PHYSICAL EXAM:  VITAL SIGNS: ED Triage Vitals [03/03/20 0928]  Enc Vitals Group     BP      Pulse      Resp      Temp      Temp src      SpO2      Weight 180 lb (81.6 kg)     Height 5\' 9"  (1.753 m)     Head Circumference      Peak Flow      Pain Score 6     Pain Loc      Pain Edu?      Excl. in [place]?     Constitutional: Alert and oriented. Well appearing and in no acute distress. Eyes: Conjunctivae are normal. Head: Atraumatic. Nose: No congestion/rhinnorhea. Mouth/Throat: Mucous membranes are moist. Neck: No stridor.  Cardiovascular: Normal rate, regular rhythm. Grossly normal heart sounds.  Good peripheral circulation.  Mild discomfort along the right chest when he takes deep inspiration.  No lesions.  No crepitus. Respiratory: Normal respiratory effort.  No retractions. Lungs CTAB. Gastrointestinal: Soft and nontender. No distention.  Negative Murphy.  No pain to deep palpation in the upper abdomen right upper quadrant. Musculoskeletal: No lower extremity tenderness nor edema. Neurologic:  Normal speech and language. No gross focal neurologic deficits are appreciated.  Skin:  Skin is warm, dry and intact. No rash noted. Psychiatric: Mood and affect are normal. Speech and behavior are normal.  ____________________________________________   LABS (all labs ordered are listed, but only abnormal results are displayed)  Labs Reviewed  BASIC METABOLIC PANEL - Abnormal; Notable for the following components:      Result Value   Glucose, Bld 150 (*)    Creatinine, Ser 1.27 (*)    All other components within normal  limits  FIBRIN DERIVATIVES D-DIMER ([facility] ONLY) - Abnormal; Notable for the following components:   Fibrin derivatives D-dimer ([facility]) 765.64 (*)    All other components within normal limits  RESPIRATORY PANEL BY RT PCR (FLU A&B, COVID)  CBC  TROPONIN I (HIGH SENSITIVITY)  TROPONIN I (HIGH SENSITIVITY)   ____________________________________________  EKG  Reviewed interpreted 940 Heart rate 80 QRS 100 QTc 430 Normal sinus rhythm, no evidence of acute ischemia or ectopy ____________________________________________  RADIOLOGY  DG Chest 2 View  Result Date: 03/03/2020 CLINICAL DATA:  Chest pain. EXAM: CHEST - 2 VIEW COMPARISON:  01/16/2019 FINDINGS: The cardiomediastinal silhouette is unchanged. The lungs are hypoinflated with mild posterior basilar opacity on the lateral radiograph favoring atelectasis. No segmental airspace consolidation, overt pulmonary edema, pleural effusion, pneumothorax is identified. No acute osseous abnormality is seen.  IMPRESSION: Hypoinflation with mild basilar atelectasis. Electronically Signed   By: Logan Bores M.D.   On: 03/03/2020 10:10   CT Angio Chest PE W and/or Wo Contrast  Result Date: 03/03/2020 CLINICAL DATA:  Chest pain with pleurisy or effusion suspected. EXAM: CT ANGIOGRAPHY CHEST WITH CONTRAST TECHNIQUE: Multidetector CT imaging of the chest was performed using the standard protocol during bolus administration of intravenous contrast. Multiplanar CT image reconstructions and MIPs were obtained to evaluate the vascular anatomy. CONTRAST:  1mL OMNIPAQUE IOHEXOL 350 MG/ML SOLN COMPARISON:  None. FINDINGS: Cardiovascular: Normal heart size. No pericardial effusion. No pulmonary artery filling defect. Normal appearance of the aorta. Mediastinum/Nodes: Negative for adenopathy or mass. Lungs/Pleura: Dependent atelectasis and trace pleural effusions. No consolidation or pulmonary edema. Upper Abdomen: Postoperative splenic flexure. Musculoskeletal: No  acute finding. Review of the MIP images confirms the above findings. IMPRESSION: 1. Trace pleural effusions with lower lobe atelectasis. 2. Negative for pulmonary embolism. Electronically Signed   By: Monte Fantasia M.D.   On: 03/03/2020 11:19    Chest x-ray reviewed no acute findings noted.  CT chest to rule out PE reviewed, trace pleural effusions with atelectasis.  No PE.  Personally viewed by me ____________________________________________   PROCEDURES  Procedure(s) performed: None  Procedures  Critical Care performed: No  ____________________________________________   INITIAL IMPRESSION / ASSESSMENT AND PLAN / ED COURSE  Pertinent labs & imaging results that were available during my care of the patient were reviewed by me and considered in my medical decision making (see chart for details).   Differential diagnosis includes, but is not limited to, ACS, aortic dissection, pulmonary embolism, cardiac tamponade, pneumothorax, pneumonia, pericarditis, myocarditis, GI-related causes including esophagitis/gastritis, and musculoskeletal chest wall pain.    Patient does have a history of small pleural effusion, etiology somewhat unclear to me in the past.  This is somewhat distant though about a year ago.  No noted risk factors for pulmonary embolism other than a previous history of DVT but this was provoked by active colon cancer at that time.  No hypoxia no tachycardia.  Very reassuring exam without obvious cardiac etiology.  Symptoms seem atypical.  Low risk for PE pretest, use d-dimer and this is slightly elevated, i and thus discussed with patient who is understand agreeable with plan to proceed with CT to further exclude PE and work-up his chest discomfort.----------------------------------------- 10:38 AM on 03/03/2020 -----------------------------------------  Reassuringly, he does report that his symptoms seem to resolve.  He only notices it if he bends forward or twists   HEAR  Score: 1    Discussed with the patient and his wife, comfortable plan for follow-up recommended he follow-up with his primary care and also set up close cardiology follow-up I discussed that I felt like an echocardiogram may be helpful in further work-up for his pleural effusions which are small, but worrisome for underlying pathology at this point.  Patient understanding agreeable  Return precautions and treatment recommendations and follow-up discussed with the patient who is agreeable with the plan.   ____________________________________________   FINAL CLINICAL IMPRESSION(S) / ED DIAGNOSES  Final diagnoses:  Bilateral pleural effusion  Pleurisy        Note:  This document was prepared using Dragon voice recognition software and may include unintentional dictation errors       Delman Kitten, MD 03/03/20 1617

## 2020-03-03 NOTE — ED Notes (Addendum)
Pt presents to ED with c/o of chest pain that started 2 days ago and states increasing in intensity since last night which warranted a call to PCP who advised pt to come to ED. Pt states pain on inspiration. Pt states HX of fluid on lungs about 1 year ago and states "this feels similar". Lungs clear throughout. Pt does not seem to be in distress and states pain is better when laying down. Chest pain does not seem to be reproducible at this time. Pt also mentions bilateral calf pain. Pt is A&Ox4. VSS.

## 2020-03-03 NOTE — ED Notes (Signed)
D/C and f/up discussed with pt, pt verbalized understanding. NAD. VSS.

## 2020-03-03 NOTE — ED Triage Notes (Signed)
Pt arrived via pov,ambulatory to treatment room. Pt c/o CP started increasing in intensity last night. NAD noted. Pt reports having similar to when he had fluid on lungs around 1 yr ago. Denies sob or other complaints at this time.

## 2020-03-03 NOTE — ED Notes (Signed)
Pt at XRAY

## 2020-03-03 NOTE — Telephone Encounter (Signed)
Pt. Reports he started having right sided chest pain yesterday. Was able to sleep last night. When he got up this morning, pain came back. Pain 7-8/10. Hurts on right side and into right shoulder. Hurts with deep breath and certain movement. No shortness of breath. Will have someone take him to ED now. Instructed to call 911 for worsening of symptoms.  Reason for Disposition . Pain also in shoulder(s) or arm(s) or jaw (Exception: pain is clearly made worse by movement)  Answer Assessment - Initial Assessment Questions 1. LOCATION: "Where does it hurt?"       Right side 2. RADIATION: "Does the pain go anywhere else?" (e.g., into neck, jaw, arms, back)     Shoulder 3. ONSET: "When did the chest pain begin?" (Minutes, hours or days)      Yesterday 4. PATTERN "Does the pain come and go, or has it been constant since it started?"  "Does it get worse with exertion?"      Constant this morning 5. DURATION: "How long does it last" (e.g., seconds, minutes, hours)     Hurts now 6. SEVERITY: "How bad is the pain?"  (e.g., Scale 1-10; mild, moderate, or severe)    - MILD (1-3): doesn't interfere with normal activities     - MODERATE (4-7): interferes with normal activities or awakens from sleep    - SEVERE (8-10): excruciating pain, unable to do any normal activities       7-8 7. CARDIAC RISK FACTORS: "Do you have any history of heart problems or risk factors for heart disease?" (e.g., angina, prior heart attack; diabetes, high blood pressure, high cholesterol, smoker, or strong family history of heart disease)     No 8. PULMONARY RISK FACTORS: "Do you have any history of lung disease?"  (e.g., blood clots in lung, asthma, emphysema, birth control pills)     Fluid on lung 9. CAUSE: "What do you think is causing the chest pain?"     Unsure 10. OTHER SYMPTOMS: "Do you have any other symptoms?" (e.g., dizziness, nausea, vomiting, sweating, fever, difficulty breathing, cough)       Pain with deep  breath 11. PREGNANCY: "Is there any chance you are pregnant?" "When was your last menstrual period?"       n/a  Protocols used: CHEST PAIN-A-AH

## 2020-03-07 ENCOUNTER — Encounter: Payer: Self-pay | Admitting: Cardiology

## 2020-03-07 ENCOUNTER — Ambulatory Visit: Payer: 59 | Admitting: Cardiology

## 2020-03-07 ENCOUNTER — Other Ambulatory Visit: Payer: Self-pay

## 2020-03-07 VITALS — BP 162/100 | HR 75 | Ht 69.0 in | Wt 184.4 lb

## 2020-03-07 DIAGNOSIS — J90 Pleural effusion, not elsewhere classified: Secondary | ICD-10-CM

## 2020-03-07 DIAGNOSIS — R079 Chest pain, unspecified: Secondary | ICD-10-CM | POA: Diagnosis not present

## 2020-03-07 DIAGNOSIS — R030 Elevated blood-pressure reading, without diagnosis of hypertension: Secondary | ICD-10-CM | POA: Diagnosis not present

## 2020-03-07 NOTE — Progress Notes (Signed)
Cardiology Office Note:    Date:  03/07/2020   ID:  [name], DOB 1970-06-27, MRN [id number]  PCP:  Robert Guzman., MD  [facility] Cardiologist:  Kate Sable, MD  [place] Electrophysiologist:  None   Referring MD: Robert Guzman.,*   Chief Complaint  Patient presents with  . OTHER    F/u ED CP and bilateral pleural effusion c/o chest pain when taking deep breaths. Meds reviewed verbally with pt.   Robert Guzman is a 49 y.o. male who is being seen today for the evaluation of chest pain at the request of Robert Guzman.,*.   History of Present Illness:    Robert Guzman is a 49 y.o. male with a hx of colon cancer status post resection plus chemo over 15 years ago, pleural effusion who presents due to chest pain.  Patient was evaluated in the ED 5 days 11/11 ago presenting with chest tightness.  Chest discomfort was worse with respiration.  Chest CTA was negative for PE, trace pleural effusions noted.  He denies chest pain or shortness of breath related with exertion.  Denies any recent illness.  States having a similar episode a year ago and was diagnosed with pleural effusion.  Upon discharge, patient was advised to follow-up with cardiology.  He states his symptoms are much improved compared to a few days ago when he presented to the ED.  He is able to take good, deep breaths now.  Last chemo was over 15 years ago, has yearly colonoscopies without any recurrence of cancer.  Past Medical History:  Diagnosis Date  . Colon cancer ([place]) 2006   COLON/ chemo  . Encounter for blood transfusion    for colon cancer  . Genetic testing 2007   Negative     Past Surgical History:  Procedure Laterality Date  . COLON SURGERY  Nov 2006  . COLONOSCOPY  05-28-12  . COLONOSCOPY WITH PROPOFOL N/A 07/13/2015   Procedure: COLONOSCOPY WITH PROPOFOL;  Surgeon: Robert Bellow, MD;  Location: [facility] ENDOSCOPY;  Service: Endoscopy;  Laterality: N/A;    . TOOTH EXTRACTION      Current Medications: No outpatient medications have been marked as taking for the 03/07/20 encounter (Office Visit) with Kate Sable, MD.     Allergies:   Patient has no known allergies.   Social History   Socioeconomic History  . Marital status: Married    Spouse name: Not on file  . Number of children: Not on file  . Years of education: Not on file  . Highest education level: Not on file  Occupational History  . Not on file  Tobacco Use  . Smoking status: Former Research scientist (life sciences)  . Smokeless tobacco: Never Used  Substance and Sexual Activity  . Alcohol use: Yes    Alcohol/week: 1.0 standard drink    Types: 1 Cans of beer per week  . Drug use: No  . Sexual activity: Yes  Other Topics Concern  . 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 and 96 y.o.   Social Determinants of Health   Financial Resource Strain:   . Difficulty of Paying Living Expenses: Not on file  Food Insecurity:   . Worried About Charity fundraiser in the Last Year: Not on file  . Ran Out of Food in the Last Year: Not on file  Transportation Needs:   . Lack of Transportation (  Medical): Not on file  . Lack of Transportation (Non-Medical): Not on file  Physical Activity:   . Days of Exercise per Week: Not on file  . Minutes of Exercise per Session: Not on file  Stress:   . Feeling of Stress : Not on file  Social Connections:   . Frequency of Communication with Friends and Family: Not on file  . Frequency of Social Gatherings with Friends and Family: Not on file  . Attends Religious Services: Not on file  . Active Member of Clubs or Organizations: Not on file  . Attends Archivist Meetings: Not on file  . Marital Status: Not on file     Family History: The patient's family history includes Cancer in his mother; Diabetes in his father; Heart attack in his father; Hypertension in his father.  ROS:   Please see the history  of present illness.     All other systems reviewed and are negative.  EKGs/Labs/Other Studies Reviewed:    The following studies were reviewed today:   EKG:  EKG is  ordered today.  The ekg ordered today demonstrates normal sinus rhythm, normal ECG.  Recent Labs: 03/03/2020: BUN 13; Creatinine, Ser 1.27; Hemoglobin 15.1; Platelets 174; Potassium 4.3; Sodium 138  Recent Lipid Panel    Component Value Date/Time   CHOL 208 (H) 12/31/2018 0926   TRIG 119.0 12/31/2018 0926   HDL 36.50 (L) 12/31/2018 0926   CHOLHDL 6 12/31/2018 0926   VLDL 23.8 12/31/2018 0926   LDLCALC 147 (H) 12/31/2018 0926     Risk Assessment/Calculations:      Physical Exam:    VS:  BP (!) 162/100 (BP Location: Right Arm, Patient Position: Sitting, Cuff Size: Normal)   Pulse 75   Ht 5\' 9"  (1.753 m)   Wt 184 lb 6 oz (83.6 kg)   SpO2 98%   BMI 27.23 kg/m     Wt Readings from Last 3 Encounters:  03/07/20 184 lb 6 oz (83.6 kg)  03/03/20 180 lb (81.6 kg)  12/08/19 188 lb 6.4 oz (85.5 kg)     GEN:  Well nourished, well developed in no acute distress HEENT: Normal NECK: No JVD; No carotid bruits LYMPHATICS: No lymphadenopathy CARDIAC: RRR, no murmurs, rubs, gallops RESPIRATORY: Decreased breath sounds at the right lung base ABDOMEN: Soft, non-tender, non-distended MUSCULOSKELETAL:  No edema; No deformity  SKIN: Warm and dry NEUROLOGIC:  Alert and oriented x 3 PSYCHIATRIC:  Normal affect   ASSESSMENT:    1. Chest pain of uncertain etiology   2. Pleural effusion   3. Elevated BP without diagnosis of hypertension    PLAN:    In order of problems listed above:  1. Patient with pleuritic chest pain, pleural effusion on recent CT scan.  Symptoms suggest inflammation of the pleural space.  Will get echocardiogram to evaluate systolic and diastolic function.  If this is normal, treatment with nonsteroidals might help patient.  I would leave this up to primary care provider.  His symptoms are not  consistent with CAD. 2. Trivial bilateral pleural effusion.  Echocardiogram as above. 3. BP elevated today, usually normal, chest discomfort likely contributing.  Continue to monitor.  Follow-up after echocardiogram.  Shared Decision Making/Informed Consent       Medication Adjustments/Labs and Tests Ordered: Current medicines are reviewed at length with the patient today.  Concerns regarding medicines are outlined above.  Orders Placed This Encounter  Procedures  . EKG 12-Lead  . ECHOCARDIOGRAM COMPLETE   No  orders of the defined types were placed in this encounter.   Patient Instructions  Medication Instructions:   Your physician recommends that you continue on your current medications as directed. Please refer to the Current Medication list given to you today. *If you need a refill on your cardiac medications before your next appointment, please call your pharmacy*   Lab Work: None Ordered If you have labs (blood work) drawn today and your tests are completely normal, you will receive your results only by: Marland Kitchen MyChart Message (if you have MyChart) OR . A paper copy in the mail If you have any lab test that is abnormal or we need to change your treatment, we will call you to review the results.   Testing/Procedures:  Your physician has requested that you have an echocardiogram. Echocardiography is a painless test that uses sound waves to create images of your heart. It provides your doctor with information about the size and shape of your heart and how well your heart's chambers and valves are working. This procedure takes approximately one hour. There are no restrictions for this procedure.    Follow-Up: At [facility], you and your health needs are our priority.  As part of our continuing mission to provide you with exceptional heart care, we have created designated Provider Care Teams.  These Care Teams include your primary Cardiologist (physician) and Advanced  Practice Providers (APPs -  Physician Assistants and Nurse Practitioners) who all work together to provide you with the care you need, when you need it.  We recommend signing up for the patient portal called "MyChart".  Sign up information is provided on this After Visit Summary.  MyChart is used to connect with patients for Virtual Visits (Telemedicine).  Patients are able to view lab/test results, encounter notes, upcoming appointments, etc.  Non-urgent messages can be sent to your provider as well.   To learn more about what you can do with MyChart, go to [email or web address].    Your next appointment:   Follow up after Echo    The format for your next appointment:   In Person  Provider:   Kate Sable, MD   Other Instructions      Signed, Kate Sable, MD  03/07/2020 5:14 PM    [place]

## 2020-03-07 NOTE — Patient Instructions (Signed)

## 2020-03-15 ENCOUNTER — Other Ambulatory Visit: Payer: Self-pay

## 2020-03-15 ENCOUNTER — Ambulatory Visit (INDEPENDENT_AMBULATORY_CARE_PROVIDER_SITE_OTHER): Payer: 59

## 2020-03-15 DIAGNOSIS — R079 Chest pain, unspecified: Secondary | ICD-10-CM | POA: Diagnosis not present

## 2020-03-15 LAB — ECHOCARDIOGRAM COMPLETE
AR max vel: 3.5 cm2
AV Area VTI: 3.53 cm2
AV Area mean vel: 3.61 cm2
AV Mean grad: 3 mmHg
AV Peak grad: 4.8 mmHg
Ao pk vel: 1.1 m/s
Area-P 1/2: 3.01 cm2
Calc EF: 51.9 %
S' Lateral: 3 cm
Single Plane A2C EF: 51.1 %
Single Plane A4C EF: 52.2 %

## 2020-03-21 ENCOUNTER — Telehealth: Payer: Self-pay | Admitting: Cardiology

## 2020-03-21 NOTE — Telephone Encounter (Signed)
Gave patient the below copied and pasted result note:  Normal systolic and diastolic function, no findings to suggest etiology of chest pain.    Patient was appreciative for the call back.

## 2020-03-21 NOTE — Telephone Encounter (Signed)
Patient calling to discuss recent echo results   Please call

## 2020-03-29 ENCOUNTER — Ambulatory Visit: Payer: 59 | Admitting: Cardiology

## 2020-04-19 ENCOUNTER — Ambulatory Visit: Payer: Self-pay | Admitting: Family Medicine

## 2020-06-24 NOTE — Progress Notes (Signed)
I,April Miller,acting as a scribe for Wilhemena Durie, MD.,have documented all relevant documentation on the behalf of Wilhemena Durie, MD,as directed by  Wilhemena Durie, MD while in the presence of Wilhemena Durie, MD.   Complete physical exam   Patient: Robert Guzman   DOB: 01-01-1971   50 y.o. Male  MRN: [id number] Visit Date: 06/27/2020  Today's healthcare provider: Wilhemena Durie, MD   Chief Complaint  Patient presents with  . Annual Exam   Subjective    Robert Guzman is a 51 y.o. male who presents today for a complete physical exam.  He reports consuming a general diet. Home exercise routine includes run. He generally feels well. He reports sleeping well. He does not have additional problems to discuss today.  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  He is from [facility] [facility].  Is a 20 year old daughter and a 25 year old son.  He feels well and has recovered from neck pain from last fall and chest discomfort from over the winter.  He started running again last week. HPI    Past Medical History:  Diagnosis Date  . Colon cancer ([place]) 2006   COLON/ chemo  . Encounter for blood transfusion    for colon cancer  . Genetic testing 2007   Negative    Past Surgical History:  Procedure Laterality Date  . COLON SURGERY  Nov 2006  . COLONOSCOPY  05-28-12  . COLONOSCOPY WITH PROPOFOL N/A 07/13/2015   Procedure: COLONOSCOPY WITH PROPOFOL;  Surgeon: Emerald Bellow, MD;  Location: [facility] ENDOSCOPY;  Service: Endoscopy;  Laterality: N/A;  . TOOTH EXTRACTION     Social History   Socioeconomic History  . Marital status: Married    Spouse name: Not on file  . Number of children: Not on file  . Years of education: Not on file  . Highest education level: Not on file  Occupational History  . Not on file  Tobacco Use  . Smoking status: Former Research scientist (life sciences)  . Smokeless tobacco: Never Used  Substance and Sexual Activity   . Alcohol use: Yes    Alcohol/week: 1.0 standard drink    Types: 1 Cans of beer per week  . Drug use: No  . Sexual activity: Yes  Other Topics Concern  . 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   Social Determinants of Health   Financial Resource Strain: Not on file  Food Insecurity: Not on file  Transportation Needs: Not on file  Physical Activity: Not on file  Stress: Not on file  Social Connections: Not on file  Intimate Partner Violence: Not on file   Family Status  Relation Name Status  . Mother  Alive  . Father  Alive  . Sister  Alive  . Brother  Alive   Family History  Problem Relation Age of Onset  . Cancer Mother   . Diabetes Father   . Hypertension Father   . Heart attack Father    No Known Allergies  Patient Care Team: Jerrol Banana., MD as PCP - General (Family Medicine) Kate Sable, MD as PCP - Cardiology (Cardiology) Bary Castilla Forest Gleason, MD (General Surgery) Jerrol Banana., MD (Family Medicine)   Medications: No outpatient medications prior to visit.   No facility-administered medications prior to visit.    Review of Systems  Musculoskeletal: Positive  for back pain and neck pain.  All other systems reviewed and are negative.      Objective    BP (!) 147/84 (BP Location: Right Arm, Patient Position: Sitting, Cuff Size: Large)   Pulse 81   Temp 98.3 F (36.8 C) (Oral)   Resp 16   Ht 5\' 9"  (1.753 m)   Wt 192 lb (87.1 kg)   SpO2 98%   BMI 28.35 kg/m  BP Readings from Last 3 Encounters:  06/27/20 (!) 147/84  03/07/20 (!) 162/100  03/03/20 123/90   Wt Readings from Last 3 Encounters:  06/27/20 192 lb (87.1 kg)  03/07/20 184 lb 6 oz (83.6 kg)  03/03/20 180 lb (81.6 kg)      Physical Exam Vitals reviewed.  Constitutional:      Appearance: Normal appearance.  HENT:     Head: Normocephalic and atraumatic.     Right Ear: External ear normal.     Left  Ear: External ear normal.  Eyes:     General: No scleral icterus.    Conjunctiva/sclera: Conjunctivae normal.  Neck:     Vascular: No carotid bruit.  Cardiovascular:     Rate and Rhythm: Normal rate and regular rhythm.     Pulses: Normal pulses.     Heart sounds: Normal heart sounds.  Pulmonary:     Effort: Pulmonary effort is normal.     Breath sounds: Normal breath sounds.  Abdominal:     Palpations: Abdomen is soft.  Genitourinary:    Penis: Normal.      Testes: Normal.  Musculoskeletal:     Right lower leg: No edema.     Left lower leg: No edema.  Lymphadenopathy:     Cervical: No cervical adenopathy.  Skin:    General: Skin is warm and dry.     Comments: Some atypical nevi on the back.  They are small.  Neurological:     General: No focal deficit present.     Mental Status: He is alert and oriented to person, place, and time.  Psychiatric:        Mood and Affect: Mood normal.        Behavior: Behavior normal.        Thought Content: Thought content normal.        Judgment: Judgment normal.       Last depression screening scores PHQ 2/9 Scores 06/27/2020 12/08/2019 12/31/2018  PHQ - 2 Score 0 2 1  PHQ- 9 Score 3 7 3    Last fall risk screening Fall Risk  12/08/2019  Falls in the past year? 0  Number falls in past yr: 0  Injury with Fall? 0  Follow up Falls evaluation completed   Last Audit-C alcohol use screening Alcohol Use Disorder Test (AUDIT) 06/27/2020  1. How often do you have a drink containing alcohol? 2  2. How many drinks containing alcohol do you have on a typical day when you are drinking? 0  3. How often do you have six or more drinks on one occasion? 0  AUDIT-C Score 2  Alcohol Brief Interventions/Follow-up AUDIT Score <7 follow-up not indicated   A score of 3 or more in women, and 4 or more in men indicates increased risk for alcohol abuse, EXCEPT if all of the points are from question 1   No results found for any visits on 06/27/20.  Assessment  & Plan    Routine Health Maintenance and Physical Exam  Exercise Activities and Dietary recommendations Goals  None     Immunization History  Administered Date(s) Administered  . Influenza,inj,Quad PF,6+ Mos 01/09/2017, 12/31/2018  . Tdap 12/31/2018    Health Maintenance  Topic Date Due  . Hepatitis C Screening  Never done  . COVID-19 Vaccine (1) Never done  . INFLUENZA VACCINE  11/22/2019  . COLONOSCOPY (Pts 45-40yrs Insurance coverage will need to be confirmed)  07/12/2025  . TETANUS/TDAP  12/30/2028  . HIV Screening  Completed  . HPV VACCINES  Aged Out    Discussed health benefits of physical activity, and encouraged him to engage in regular exercise appropriate for his age and condition.  1. Annual physical exam Follow-up 1 year. - Lipid panel - TSH - CBC w/Diff/Platelet - Comprehensive Metabolic Panel (CMET)  2. Pure hypertriglyceridemia   3. Screening for prostate cancer  - PSA  4. Screening for blood or protein in urine  - POCT urinalysis dipstick--Negative  5. Atypical nevus  - Ambulatory referral to Dermatology  6. History of colon cancer in adulthood Colon cancer at age 72.  Refer to GI for follow-up. - Ambulatory referral to Gastroenterology   Return in about 1 year (around 06/27/2021).     I, Wilhemena Durie, MD, have reviewed all documentation for this visit. The documentation on 06/27/20 for the exam, diagnosis, procedures, and orders are all accurate and complete.    Tynisa Vohs Cranford Mon, MD  [facility] [phone] (phone) [phone] (fax)  [place]

## 2020-06-27 ENCOUNTER — Other Ambulatory Visit: Payer: Self-pay

## 2020-06-27 ENCOUNTER — Encounter: Payer: Self-pay | Admitting: Family Medicine

## 2020-06-27 ENCOUNTER — Ambulatory Visit (INDEPENDENT_AMBULATORY_CARE_PROVIDER_SITE_OTHER): Payer: 59 | Admitting: Family Medicine

## 2020-06-27 VITALS — BP 147/84 | HR 81 | Temp 98.3°F | Resp 16 | Ht 69.0 in | Wt 192.0 lb

## 2020-06-27 DIAGNOSIS — Z125 Encounter for screening for malignant neoplasm of prostate: Secondary | ICD-10-CM | POA: Diagnosis not present

## 2020-06-27 DIAGNOSIS — E781 Pure hyperglyceridemia: Secondary | ICD-10-CM | POA: Diagnosis not present

## 2020-06-27 DIAGNOSIS — Z85038 Personal history of other malignant neoplasm of large intestine: Secondary | ICD-10-CM

## 2020-06-27 DIAGNOSIS — Z1389 Encounter for screening for other disorder: Secondary | ICD-10-CM | POA: Diagnosis not present

## 2020-06-27 DIAGNOSIS — Z0000 Encounter for general adult medical examination without abnormal findings: Secondary | ICD-10-CM | POA: Diagnosis not present

## 2020-06-27 DIAGNOSIS — D229 Melanocytic nevi, unspecified: Secondary | ICD-10-CM

## 2020-06-27 LAB — POCT URINALYSIS DIPSTICK
Bilirubin, UA: NEGATIVE
Blood, UA: NEGATIVE
Glucose, UA: NEGATIVE
Ketones, UA: NEGATIVE
Leukocytes, UA: NEGATIVE
Nitrite, UA: NEGATIVE
Protein, UA: NEGATIVE
Spec Grav, UA: 1.01 (ref 1.010–1.025)
Urobilinogen, UA: 0.2 E.U./dL
pH, UA: 8 (ref 5.0–8.0)

## 2020-06-28 LAB — LIPID PANEL
Chol/HDL Ratio: 4.5 ratio (ref 0.0–5.0)
Cholesterol, Total: 230 mg/dL — ABNORMAL HIGH (ref 100–199)
HDL: 51 mg/dL (ref >39)
LDL Chol Calc (NIH): 163 mg/dL — ABNORMAL HIGH (ref 0–99)
Triglycerides: 93 mg/dL (ref 0–149)
VLDL Cholesterol Cal: 16 mg/dL (ref 5–40)

## 2020-06-28 LAB — CBC WITH DIFFERENTIAL/PLATELET
Basophils Absolute: 0 10*3/uL (ref 0.0–0.2)
Basos: 0 %
EOS (ABSOLUTE): 0 10*3/uL (ref 0.0–0.4)
Eos: 1 %
Hematocrit: 46 % (ref 37.5–51.0)
Hemoglobin: 16.2 g/dL (ref 13.0–17.7)
Immature Grans (Abs): 0 10*3/uL (ref 0.0–0.1)
Immature Granulocytes: 0 %
Lymphocytes Absolute: 1.5 10*3/uL (ref 0.7–3.1)
Lymphs: 31 %
MCH: 30.9 pg (ref 26.6–33.0)
MCHC: 35.2 g/dL (ref 31.5–35.7)
MCV: 88 fL (ref 79–97)
Monocytes Absolute: 0.5 10*3/uL (ref 0.1–0.9)
Monocytes: 9 %
Neutrophils Absolute: 2.9 10*3/uL (ref 1.4–7.0)
Neutrophils: 59 %
Platelets: 213 10*3/uL (ref 150–450)
RBC: 5.24 x10E6/uL (ref 4.14–5.80)
RDW: 14.2 % (ref 11.6–15.4)
WBC: 4.9 10*3/uL (ref 3.4–10.8)

## 2020-06-28 LAB — COMPREHENSIVE METABOLIC PANEL
ALT: 18 IU/L (ref 0–44)
AST: 21 IU/L (ref 0–40)
Albumin/Globulin Ratio: 2.3 — ABNORMAL HIGH (ref 1.2–2.2)
Albumin: 5 g/dL (ref 4.0–5.0)
Alkaline Phosphatase: 52 IU/L (ref 44–121)
BUN/Creatinine Ratio: 8 — ABNORMAL LOW (ref 9–20)
BUN: 11 mg/dL (ref 6–24)
Bilirubin Total: 1 mg/dL (ref 0.0–1.2)
CO2: 23 mmol/L (ref 20–29)
Calcium: 9.4 mg/dL (ref 8.7–10.2)
Chloride: 102 mmol/L (ref 96–106)
Creatinine, Ser: 1.3 mg/dL — ABNORMAL HIGH (ref 0.76–1.27)
Globulin, Total: 2.2 g/dL (ref 1.5–4.5)
Glucose: 94 mg/dL (ref 65–99)
Potassium: 4.6 mmol/L (ref 3.5–5.2)
Sodium: 141 mmol/L (ref 134–144)
Total Protein: 7.2 g/dL (ref 6.0–8.5)
eGFR: 68 mL/min/{1.73_m2} (ref >59)

## 2020-06-28 LAB — TSH: TSH: 1.82 u[IU]/mL (ref 0.450–4.500)

## 2020-06-28 LAB — PSA: Prostate Specific Ag, Serum: 0.4 ng/mL (ref 0.0–4.0)

## 2020-07-01 ENCOUNTER — Other Ambulatory Visit: Payer: Self-pay

## 2020-07-01 ENCOUNTER — Telehealth (INDEPENDENT_AMBULATORY_CARE_PROVIDER_SITE_OTHER): Payer: Self-pay | Admitting: Gastroenterology

## 2020-07-01 DIAGNOSIS — Z85038 Personal history of other malignant neoplasm of large intestine: Secondary | ICD-10-CM

## 2020-07-01 NOTE — Progress Notes (Signed)
Gastroenterology Pre-Procedure Review  Request Date: 12/30/20 Requesting Physician: Dr. Vicente Males  PATIENT REVIEW QUESTIONS: The patient responded to the following health history questions as indicated:    1. Are you having any GI issues? no 2. Do you have a personal history of Polyps? Personal history of colon cancer 3. Do you have a family history of Colon Cancer or Polyps? no 4. Diabetes Mellitus? no 5. Joint replacements in the past 12 months?no 6. Major health problems in the past 3 months?no 7. Any artificial heart valves, MVP, or defibrillator?no    MEDICATIONS & ALLERGIES:    Patient reports the following regarding taking any anticoagulation/antiplatelet therapy:   Plavix, Coumadin, Eliquis, Xarelto, Lovenox, Pradaxa, Brilinta, or Effient? no Aspirin? no  Patient confirms/reports the following medications:  No current outpatient medications on file.   No current facility-administered medications for this visit.    Patient confirms/reports the following allergies:  No Known Allergies  Orders Placed This Encounter  Procedures  . Procedural/ Surgical Case Request: COLONOSCOPY WITH PROPOFOL    Standing Status:   Standing    Number of Occurrences:   1    Order Specific Question:   Pre-op diagnosis    Answer:   personal history of colon cancer    Order Specific Question:   CPT Code    Answer:   35465    AUTHORIZATION INFORMATION Primary Insurance: 1D#: Group #:  Secondary Insurance: 1D#: Group #:  SCHEDULE INFORMATION: Date: 12/30/20 Time: Location:[facility]

## 2020-12-30 ENCOUNTER — Ambulatory Visit: Payer: 59 | Admitting: Anesthesiology

## 2020-12-30 ENCOUNTER — Encounter
Admission: RE | Disposition: A | Discharge status: Home / Self Care | Payer: Self-pay | Source: Home / Self Care | Attending: Gastroenterology

## 2020-12-30 ENCOUNTER — Encounter: Payer: Self-pay | Admitting: Gastroenterology

## 2020-12-30 ENCOUNTER — Ambulatory Visit
Admission: RE | Admit: 2020-12-30 | Discharge: 2020-12-30 | Disposition: A | Discharge status: Home / Self Care | Payer: 59 | Attending: Gastroenterology | Admitting: Gastroenterology

## 2020-12-30 DIAGNOSIS — Z85038 Personal history of other malignant neoplasm of large intestine: Secondary | ICD-10-CM | POA: Diagnosis not present

## 2020-12-30 DIAGNOSIS — Z1211 Encounter for screening for malignant neoplasm of colon: Secondary | ICD-10-CM | POA: Insufficient documentation

## 2020-12-30 DIAGNOSIS — D123 Benign neoplasm of transverse colon: Secondary | ICD-10-CM

## 2020-12-30 DIAGNOSIS — Z980 Intestinal bypass and anastomosis status: Secondary | ICD-10-CM | POA: Insufficient documentation

## 2020-12-30 DIAGNOSIS — K635 Polyp of colon: Secondary | ICD-10-CM

## 2020-12-30 DIAGNOSIS — Z87891 Personal history of nicotine dependence: Secondary | ICD-10-CM | POA: Insufficient documentation

## 2020-12-30 HISTORY — PX: COLONOSCOPY WITH PROPOFOL: SHX5780

## 2020-12-30 SURGERY — COLONOSCOPY WITH PROPOFOL
Anesthesia: General

## 2020-12-30 MED ORDER — PROPOFOL 500 MG/50ML IV EMUL
INTRAVENOUS | Status: AC
  Filled 2020-12-30: qty 50

## 2020-12-30 MED ORDER — PROPOFOL 10 MG/ML IV BOLUS
INTRAVENOUS | Status: AC
  Filled 2020-12-30: qty 20

## 2020-12-30 MED ORDER — PROPOFOL 500 MG/50ML IV EMUL
INTRAVENOUS | Status: DC | PRN
  Administered 2020-12-30: 150 ug/kg/min via INTRAVENOUS

## 2020-12-30 MED ORDER — SODIUM CHLORIDE 0.9 % IV SOLN: INTRAVENOUS | Status: DC

## 2020-12-30 MED ORDER — PROPOFOL 10 MG/ML IV BOLUS
INTRAVENOUS | Status: DC | PRN
  Administered 2020-12-30: 100 mg via INTRAVENOUS
  Administered 2020-12-30: 30 mg via INTRAVENOUS

## 2020-12-30 MED ORDER — LIDOCAINE HCL (PF) 2 % IJ SOLN
INTRAMUSCULAR | Status: AC
  Filled 2020-12-30: qty 5

## 2020-12-30 NOTE — Op Note (Signed)
[facility] Gastroenterology Patient Name: Robert Guzman Procedure Date: 12/30/2020 7:47 AM MRN: [id number] Account #: [id number] Date of Birth: 06/04/1970 Admit Type: Outpatient Age: 50 Room: [facility] ENDO ROOM 4 Gender: Male Note Status: Finalized Instrument Name: Jasper Riling [id number] Procedure:             Colonoscopy Indications:           High risk colon cancer surveillance: Personal history                         of colon cancer Providers:             Jonathon Bellows MD, MD Medicines:             Monitored Anesthesia Care Complications:         No immediate complications. Procedure:             Pre-Anesthesia Assessment:                        - Prior to the procedure, a History and Physical was                         performed, and patient medications, allergies and                         sensitivities were reviewed. The patient's tolerance                         of previous anesthesia was reviewed.                        - The risks and benefits of the procedure and the                         sedation options and risks were discussed with the                         patient. All questions were answered and informed                         consent was obtained.                        - ASA Grade Assessment: II - A patient with mild                         systemic disease.                        After obtaining informed consent, the colonoscope was                         passed under direct vision. Throughout the procedure,                         the patient's blood pressure, pulse, and oxygen                         saturations were monitored continuously. The  Colonoscope was introduced through the anus and                         advanced to the the ileocolonic anastomosis. The                         colonoscopy was performed with ease. The patient                         tolerated the procedure well. The quality of the bowel                          preparation was good. Findings:      The perianal and digital rectal examinations were normal.      Four sessile polyps were found in the descending colon. The polyps were       5 to 7 mm in size. These polyps were removed with a cold snare.       Resection and retrieval were complete.      There was evidence of a prior end-to-end ileo-colonic anastomosis in the       transverse colon. This was patent and was characterized by healthy       appearing mucosa.      A 8 mm polyp was found in the transverse colon. The polyp was sessile.       The polyp was removed with a cold snare. Resection and retrieval were       complete.      A 4 mm polyp was found in the transverse colon. The polyp was sessile.       The polyp was removed with a cold biopsy forceps. Resection and       retrieval were complete.      The exam was otherwise without abnormality on direct and retroflexion       views. Impression:            - Four 5 to 7 mm polyps in the descending colon,                         removed with a cold snare. Resected and retrieved.                        - Patent end-to-end ileo-colonic anastomosis,                         characterized by healthy appearing mucosa.                        - One 8 mm polyp in the transverse colon, removed with                         a cold snare. Resected and retrieved.                        - One 4 mm polyp in the transverse colon, removed with                         a cold biopsy forceps. Resected and retrieved.                        -  The examination was otherwise normal on direct and                         retroflexion views. Recommendation:        - Discharge patient to home (with escort).                        - Resume previous diet.                        - Continue present medications.                        - Await pathology results.                        - Repeat colonoscopy in 3 years for surveillance based                          on pathology results. Procedure Code(s):     --- Professional ---                        [phone], Colonoscopy, flexible; with removal of                         tumor(s), polyp(s), or other lesion(s) by snare                         technique                        45380, 57, Colonoscopy, flexible; with biopsy, single                         or multiple Diagnosis Code(s):     --- Professional ---                        K63.5, Polyp of colon                        Z85.038, Personal history of other malignant neoplasm                         of large intestine                        Z98.0, Intestinal bypass and anastomosis status CPT copyright 2019 American Medical Association. All rights reserved. The codes documented in this report are preliminary and upon coder review may  be revised to meet current compliance requirements. Jonathon Bellows, MD Jonathon Bellows MD, MD 12/30/2020 8:13:39 AM This report has been signed electronically. Number of Addenda: 0 Note Initiated On: 12/30/2020 7:47 AM Scope Withdrawal Time: 0 hours 18 minutes 26 seconds  Total Procedure Duration: 0 hours 19 minutes 57 seconds  Estimated Blood Loss:  Estimated blood loss: none.      [facility]

## 2020-12-30 NOTE — H&P (Signed)
     Jonathon Bellows, MD [address], [place], [place], [place], [address] [address], [place], [place], [place], [address] Phone: [phone]  Fax: [phone]  Primary Care Physician:  Jerrol Banana., MD   Pre-Procedure History & Physical: HPI:  Robert Guzman is a 50 y.o. male is here for an colonoscopy.   Past Medical History:  Diagnosis Date   Colon cancer ([place]) 2006   COLON/ chemo   Encounter for blood transfusion    for colon cancer   Genetic testing 2007   Negative     Past Surgical History:  Procedure Laterality Date   COLON SURGERY  Nov 2006   COLONOSCOPY  05-28-12   COLONOSCOPY WITH PROPOFOL N/A 07/13/2015   Procedure: COLONOSCOPY WITH PROPOFOL;  Surgeon: Nolberto Bellow, MD;  Location: [facility] ENDOSCOPY;  Service: Endoscopy;  Laterality: N/A;   TOOTH EXTRACTION      Prior to Admission medications   Not on File    Allergies as of 07/01/2020   (No Known Allergies)    Family History  Problem Relation Age of Onset   Cancer Mother    Diabetes Father    Hypertension Father    Heart attack Father     Social History   Socioeconomic History   Marital status: Married    Spouse name: Not on file   Number of children: Not on file   Years of education: Not on file   Highest education level: Not on file  Occupational History   Not on file  Tobacco Use   Smoking status: Former   Smokeless tobacco: Never  Vaping Use   Vaping Use: Never used  Substance and Sexual Activity   Alcohol use: Yes    Alcohol/week: 1.0 standard drink    Types: 1 Cans of beer per week   Drug use: No   Sexual activity: Yes  Other Topics Concern   Not on file  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   Social Determinants of Health   Financial Resource Strain: Not on file  Food Insecurity: Not on file  Transportation Needs: Not on file  Physical Activity: Not on file  Stress: Not on file  Social Connections: Not on  file  Intimate Partner Violence: Not on file    Review of Systems: See HPI, otherwise negative ROS  Physical Exam: BP (!) 131/94   Pulse 79   Temp (!) 96.6 F (35.9 C) (Temporal)   Resp 16   Ht '5\' 9"'$  (1.753 m)   Wt 83.9 kg   SpO2 100%   BMI 27.32 kg/m  General:   Alert,  pleasant and cooperative in NAD Head:  Normocephalic and atraumatic. Neck:  Supple; no masses or thyromegaly. Lungs:  Clear throughout to auscultation, normal respiratory effort.    Heart:  +S1, +S2, Regular rate and rhythm, No edema. Abdomen:  Soft, nontender and nondistended. Normal bowel sounds, without guarding, and without rebound.   Neurologic:  Alert and  oriented x4;  grossly normal neurologically.  Impression/Plan: [name] is here for an colonoscopy to be performed for Screening colonoscopy , history of colon cancer  Risks, benefits, limitations, and alternatives regarding  colonoscopy have been reviewed with the patient.  Questions have been answered.  All parties agreeable.   Jonathon Bellows, MD  12/30/2020, 7:45 AM

## 2020-12-30 NOTE — Anesthesia Postprocedure Evaluation (Signed)
Anesthesia Post Note  Patient: Robert Guzman  Procedure(s) Performed: COLONOSCOPY WITH PROPOFOL  Patient location during evaluation: Phase II Anesthesia Type: General Level of consciousness: awake and alert, awake and oriented Pain management: pain level controlled Vital Signs Assessment: post-procedure vital signs reviewed and stable Respiratory status: spontaneous breathing, nonlabored ventilation and respiratory function stable Cardiovascular status: blood pressure returned to baseline and stable Postop Assessment: no apparent nausea or vomiting Anesthetic complications: no   No notable events documented.   Last Vitals:  Vitals:   12/30/20 0824 12/30/20 0834  BP: 117/78 117/81  Pulse: 78 67  Resp: 15 14  Temp:    SpO2: 97% 98%    Last Pain:  Vitals:   12/30/20 0834  TempSrc:   PainSc: 0-No pain                 Phill Mutter

## 2020-12-30 NOTE — OR Nursing (Signed)
Surgical Anastomosis reached at 0752.

## 2020-12-30 NOTE — Anesthesia Preprocedure Evaluation (Signed)
Anesthesia Evaluation  Patient identified by MRN, date of birth, ID band Patient awake    Reviewed: Allergy & Precautions, H&P , NPO status , Patient's Chart, lab work & pertinent test results, reviewed documented beta blocker date and time   History of Anesthesia Complications Negative for: history of anesthetic complications  Airway Mallampati: II  TM Distance: >3 FB Neck ROM: full    Dental no notable dental hx. (+) Caps, Missing, Teeth Intact   Pulmonary neg pulmonary ROS, former smoker,    Pulmonary exam normal breath sounds clear to auscultation       Cardiovascular Exercise Tolerance: Good hypertension (Pt Denies, no meds), Normal cardiovascular exam Rhythm:regular Rate:Normal     Neuro/Psych Anxiety negative neurological ROS  negative psych ROS   GI/Hepatic Neg liver ROS, Bowel prep,History of colon cancer s/p resection and chemo   Endo/Other  negative endocrine ROS  Renal/GU Renal disease  negative genitourinary   Musculoskeletal   Abdominal   Peds  Hematology negative hematology ROS (+)   Anesthesia Other Findings Past Medical History:   Encounter for blood transfusion                                Comment:for colon cancer   Genetic testing                                 2007           Comment:Negative    Colon cancer ([place])                              2006           Comment:COLON/ chemo   Reproductive/Obstetrics negative OB ROS                            Anesthesia Physical  Anesthesia Plan  ASA: 2  Anesthesia Plan: General   Post-op Pain Management:    Induction: Intravenous  PONV Risk Score and Plan: 2 and Propofol infusion  Airway Management Planned: Natural Airway and Nasal Cannula  Additional Equipment:   Intra-op Plan:   Post-operative Plan:   Informed Consent: I have reviewed the patients History and Physical, chart, labs and discussed the  procedure including the risks, benefits and alternatives for the proposed anesthesia with the patient or authorized representative who has indicated his/her understanding and acceptance.     Dental Advisory Given  Plan Discussed with: Anesthesiologist, CRNA and Surgeon  Anesthesia Plan Comments:        Anesthesia Quick Evaluation

## 2020-12-30 NOTE — Transfer of Care (Signed)
Immediate Anesthesia Transfer of Care Note  Patient: Robert Guzman  Procedure(s) Performed: COLONOSCOPY WITH PROPOFOL  Patient Location: PACU  Anesthesia Type:General  Level of Consciousness: drowsy and patient cooperative  Airway & Oxygen Therapy: Patient Spontanous Breathing  Post-op Assessment: Report given to RN and Post -op Vital signs reviewed and stable  Post vital signs: Reviewed and stable  Last Vitals:  Vitals Value Taken Time  BP 89/54 12/30/20 0814  Temp    Pulse 74 12/30/20 0815  Resp 17 12/30/20 0815  SpO2 98 % 12/30/20 0815  Vitals shown include unvalidated device data.  Last Pain:  Vitals:   12/30/20 0814  TempSrc:   PainSc: 0-No pain         Complications: No notable events documented.

## 2021-01-02 ENCOUNTER — Encounter: Payer: Self-pay | Admitting: Gastroenterology

## 2021-01-02 LAB — SURGICAL PATHOLOGY

## 2021-04-06 ENCOUNTER — Encounter (HOSPITAL_COMMUNITY): Payer: Self-pay

## 2021-04-06 ENCOUNTER — Ambulatory Visit (HOSPITAL_COMMUNITY)
Admission: EM | Admit: 2021-04-06 | Discharge: 2021-04-06 | Disposition: A | Discharge status: Home / Self Care | Payer: 59 | Attending: Urgent Care | Admitting: Urgent Care

## 2021-04-06 ENCOUNTER — Other Ambulatory Visit: Payer: Self-pay

## 2021-04-06 DIAGNOSIS — E060 Acute thyroiditis: Secondary | ICD-10-CM | POA: Diagnosis present

## 2021-04-06 LAB — TSH: TSH: 1.073 u[IU]/mL (ref 0.350–4.500)

## 2021-04-06 LAB — T4, FREE: Free T4: 0.76 ng/dL (ref 0.61–1.12)

## 2021-04-06 MED ORDER — PREDNISONE 10 MG (21) PO TBPK: ORAL_TABLET | Freq: Every day | ORAL | 0 refills | Status: DC

## 2021-04-06 NOTE — ED Triage Notes (Signed)
Pt c/o sore throat today, cough and congestion x2 days. Using cough drops only.

## 2021-04-06 NOTE — Discharge Instructions (Addendum)
Take prednisone taper as directed for thyroiditis. Call you PCP and follow up in one week. Will call with results of labs once received if abnormal.  Please monitor your blood pressure at home, normal readings are closer to 120/80. If any BP remains elevated >140/90, must follow up with PCP for treatment. If any worsening neck pain, or if fever develops, head to ER.

## 2021-04-06 NOTE — ED Provider Notes (Signed)
[facility]    CSN: [id number] Arrival date & time: 04/06/21  1305      History   Chief Complaint Chief Complaint  Patient presents with   Sore Throat    HPI Robert Guzman is a 50 y.o. male.   Pleasant 50 year old male presents today with a 1 week history of a sore throat.  He states the pain is located to his anterior neck, on the left side.  Starting last evening, he states it was tender to touch.  Last week he started with some mild nasal congestion and upper respiratory symptoms, but reports that resolved without any treatment.  He endorses some discomfort with swallowing, but denies pain at his posterior pharynx.  He denies lymphadenopathy.  He denies cough, sputum, fever, rash.  He has tried over-the-counter anti-inflammatories daily without any relief.  He denies any chronic medical conditions he had a colonoscopy 1 month ago and states his blood pressure was normal at that time.   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 12/29/2018   Leukocytosis 12/29/2018   Acute renal insufficiency 12/29/2018   Pleural effusion on right 12/29/2018   HLD (hyperlipidemia) 01/09/2016   History of colon cancer 06/02/2015   History of colonic polyps 06/02/2015   Visit for well man health check 01/05/2015    Past Surgical History:  Procedure Laterality Date   COLON SURGERY  Nov 2006   COLONOSCOPY  05-28-12   COLONOSCOPY WITH  PROPOFOL N/A 07/13/2015   Procedure: COLONOSCOPY WITH PROPOFOL;  Surgeon: Gustav Bellow, MD;  Location: [facility] ENDOSCOPY;  Service: Endoscopy;  Laterality: N/A;   COLONOSCOPY WITH PROPOFOL N/A 12/30/2020   Procedure: COLONOSCOPY WITH PROPOFOL;  Surgeon: Jonathon Bellows, MD;  Location: [facility] ENDOSCOPY;  Service: Gastroenterology;  Laterality: N/A;   TOOTH EXTRACTION         Home Medications    Prior to Admission medications   Medication Sig Start Date End Date Taking? Authorizing Provider  predniSONE (STERAPRED UNI-PAK 21 TAB) 10 MG (21) TBPK tablet Take by mouth daily. Take 6 tabs by mouth daily  for 1 days, then 5 tabs for 1 days, then 4 tabs for 1 days, then 3 tabs for 1 days, 2 tabs for 1 days, then 1 tab by mouth daily for 1 days 04/06/21  Yes Johnnie Moten, Sherren Kerns, PA    Family History Family History  Problem Relation Age of Onset   Cancer Mother    Diabetes Father    Hypertension Father    Heart attack Father     Social History Social History   Tobacco Use   Smoking status: Former   Smokeless tobacco: Never  Scientific laboratory technician Use: Never used  Substance Use Topics   Alcohol use: Yes    Alcohol/week: 1.0 standard drink    Types: 1 Cans of beer per week   Drug use: No     Allergies  Patient has no known allergies.   Review of Systems Review of Systems  Constitutional:  Negative for appetite change, chills, diaphoresis, fatigue and fever.  HENT:  Positive for sore throat (anterior neck pain). Negative for rhinorrhea, sinus pressure, sinus pain and sneezing.   All other systems reviewed and are negative.   Physical Exam Triage Vital Signs ED Triage Vitals [04/06/21 1409]  Enc Vitals Group     BP (!) 167/107     Pulse Rate 74     Resp 18     Temp 99 F (37.2 C)     Temp Source Oral     SpO2 97 %     Weight      Height      Head Circumference      Peak Flow      Pain Score 6     Pain Loc      Pain Edu?      Excl. in [place]?    No data found.  Updated  Vital Signs BP (!) 167/107 (BP Location: Left Arm)    Pulse 74    Temp 99 F (37.2 C) (Oral)    Resp 18    SpO2 97%   Visual Acuity Right Eye Distance:   Left Eye Distance:   Bilateral Distance:    Right Eye Near:   Left Eye Near:    Bilateral Near:     Physical Exam Vitals and nursing note reviewed.  Constitutional:      Appearance: He is well-developed and normal weight.  HENT:     Head: Normocephalic and atraumatic.     Right Ear: Tympanic membrane and ear canal normal. No swelling or tenderness. No middle ear effusion. Tympanic membrane is not erythematous.     Left Ear: Tympanic membrane and ear canal normal. No drainage, swelling or tenderness.  No middle ear effusion. Tympanic membrane is not erythematous.     Nose: No congestion or rhinorrhea.     Mouth/Throat:     Mouth: Mucous membranes are moist. No oral lesions.     Pharynx: Oropharynx is clear. No pharyngeal swelling, oropharyngeal exudate, posterior oropharyngeal erythema or uvula swelling.     Tonsils: No tonsillar exudate or tonsillar abscesses.  Eyes:     Conjunctiva/sclera: Conjunctivae normal.     Pupils: Pupils are equal, round, and reactive to light.  Neck:     Comments: Thyroid tender and pain reproducible to palpation. Mild swelling to L thyroid gland. No appreciable mass or abscess Cardiovascular:     Rate and Rhythm: Normal rate and regular rhythm.     Heart sounds: No murmur heard. Pulmonary:     Effort: Pulmonary effort is normal. No respiratory distress.     Breath sounds: Normal breath sounds. No wheezing, rhonchi or rales.  Musculoskeletal:     Cervical back: Normal range of motion and neck supple.  Lymphadenopathy:     Cervical: No cervical adenopathy.  Skin:    General: Skin is warm.     Coloration: Skin is not pale.     Findings: No erythema or rash.  Neurological:     Mental Status: He is alert.     UC Treatments / Results  Labs (all labs ordered are listed, but only abnormal  results are displayed) Labs Reviewed  TSH  T4, FREE    EKG   Radiology No results found.  Procedures Procedures (including critical care time)  Medications Ordered in UC Medications - No data to display  Initial Impression / Assessment and Plan / UC Course  I have reviewed the triage vital signs and the nursing notes.  Pertinent labs & imaging results that were available during my care of the patient were reviewed by me and considered in my medical decision making (see chart for details).  Clinical Course as of 04/06/21 1448  Thu Apr 06, 2021  1414 BP(!): 167/107 163/99 BP recheck [WC]    Clinical Course User Index [WC] Teya Otterson L, PA    Acute thyroiditis -likely secondary to recent viral infection.  Patient has already tried NSAIDs, therefore we will try 1 Dosepak of prednisone.  Labs in office today.  Patient instructed to follow-up with his PCP in 1 week for follow-up.  Any development of fever requires ER evaluation promptly. Elevated blood pressure without diagnosis of hypertension -Per patient, blood pressure was normal 1 month ago at his colonoscopy appointment.  Suspect elevated today secondary to #1.  Patient encouraged to monitor this at home  Final Clinical Impressions(s) / UC Diagnoses   Final diagnoses:  Thyroiditis, acute nonsuppurative     Discharge Instructions      Take prednisone taper as directed for thyroiditis. Call you PCP and follow up in one week. Will call with results of labs once received if abnormal.  Please monitor your blood pressure at home, normal readings are closer to 120/80. If any BP remains elevated >140/90, must follow up with PCP for treatment. If any worsening neck pain, or if fever develops, head to ER.    ED Prescriptions     Medication Sig Dispense Auth. Provider   predniSONE (STERAPRED UNI-PAK 21 TAB) 10 MG (21) TBPK tablet Take by mouth daily. Take 6 tabs by mouth daily  for 1 days, then 5 tabs for 1 days, then  4 tabs for 1 days, then 3 tabs for 1 days, 2 tabs for 1 days, then 1 tab by mouth daily for 1 days 21 tablet Torie Towle L, PA      PDMP not reviewed this encounter.   Chaney Malling, [place] 04/06/21 1450

## 2021-06-27 NOTE — Progress Notes (Signed)
? ? ? ?Complete physical exam ? ?I,April Miller,acting as a scribe for Wilhemena Durie, MD.,have documented all relevant documentation on the behalf of Wilhemena Durie, MD,as directed by  Wilhemena Durie, MD while in the presence of Wilhemena Durie, MD. ? ? ?Patient: Robert Guzman   DOB: 1970/11/18   51 y.o. Male  MRN: [id number] ?Visit Date: 06/28/2021 ? ?Today's healthcare provider: Wilhemena Durie, MD  ? ?Chief Complaint  ?Patient presents with  ? Annual Exam  ? ?Subjective  ?  ?Robert Guzman is a 51 y.o. male who presents today for a complete physical exam.  ?He reports consuming a general diet. Gym/ health club routine includes cardio. He generally feels well. He reports sleeping fairly well. He does not have additional problems to discuss today.  ?He is married and is a father of a 25 year old daughter at [employer] and his 86 year old son who is a Paramedic at [employer]. 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 ?HPI  ? ? ?Past Medical History:  ?Diagnosis Date  ? Colon cancer [facility]) 2006  ? COLON/ chemo  ? Encounter for blood transfusion   ? for colon cancer  ? Genetic testing 2007  ? Negative   ? ?Past Surgical History:  ?Procedure Laterality Date  ? COLON SURGERY  Nov 2006  ? COLONOSCOPY  05-28-12  ? COLONOSCOPY WITH PROPOFOL N/A 07/13/2015  ? Procedure: COLONOSCOPY WITH PROPOFOL;  Surgeon: Jasyn Bellow, MD;  Location: [facility] ENDOSCOPY;  Service: Endoscopy;  Laterality: N/A;  ? COLONOSCOPY WITH PROPOFOL N/A 12/30/2020  ? Procedure: COLONOSCOPY WITH PROPOFOL;  Surgeon: Jonathon Bellows, MD;  Location: [facility] ENDOSCOPY;  Service: Gastroenterology;  Laterality: N/A;  ? TOOTH EXTRACTION    ? ?Social History  ? ?Socioeconomic History  ? Marital status: Married  ?  Spouse name: Not on file  ? Number of children: Not on file  ? Years of education: Not on file  ? Highest education level: Not on file  ?Occupational History  ? Not on file  ?Tobacco Use  ?  Smoking status: Former  ? Smokeless tobacco: Never  ?Vaping Use  ? Vaping Use: Never used  ?Substance and Sexual Activity  ? Alcohol use: Yes  ?  Alcohol/week: 1.0 standard drink  ?  Types: 1 Cans of beer per week  ? Drug use: No  ? Sexual activity: Yes  ?Other Topics Concern  ? 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  ? ?Social Determinants of Health  ? ?Financial Resource Strain: Not on file  ?Food Insecurity: Not on file  ?Transportation Needs: Not on file  ?Physical Activity: Not on file  ?Stress: Not on file  ?Social Connections: Not on file  ?Intimate Partner Violence: Not on file  ? ?Family Status  ?Relation Name Status  ? Mother  Alive  ? Father  Alive  ? Sister  Alive  ? Brother  Alive  ? ?Family History  ?Problem Relation Age of Onset  ? Cancer Mother   ? Diabetes Father   ? Hypertension Father   ? Heart attack Father   ? ?No Known Allergies  ?Patient Care Team: ?Jerrol Banana., MD as PCP - General (Family Medicine) ?Kate Sable, MD as PCP - Cardiology (Cardiology) ?Kiron Bellow, MD (General Surgery) ?Jerrol Banana., MD (Family Medicine)  ? ?Medications: ?  Outpatient Medications Prior to Visit  ?Medication Sig  ? [DISCONTINUED] predniSONE (STERAPRED UNI-PAK 21 TAB) 10 MG (21) TBPK tablet Take by mouth daily. Take 6 tabs by mouth daily  for 1 days, then 5 tabs for 1 days, then 4 tabs for 1 days, then 3 tabs for 1 days, 2 tabs for 1 days, then 1 tab by mouth daily for 1 days  ? ?No facility-administered medications prior to visit.  ? ? ?Review of Systems  ?All other systems reviewed and are negative. ? ? ? Objective  ?  ?BP (!) 147/82 (BP Location: Right Arm, Patient Position: Sitting, Cuff Size: Large)   Pulse 77   Temp 98.5 ?F (36.9 ?C) (Temporal)   Resp 14   Ht '5\' 9"'$  (1.753 m)   Wt 195 lb (88.5 kg)   SpO2 99%   BMI 28.80 kg/m?  ? ? ? ?Physical Exam ?Vitals reviewed.  ?Constitutional:   ?   Appearance: Normal  appearance.  ?HENT:  ?   Head: Normocephalic and atraumatic.  ?   Right Ear: External ear normal.  ?   Left Ear: External ear normal.  ?Eyes:  ?   General: No scleral icterus. ?   Conjunctiva/sclera: Conjunctivae normal.  ?Neck:  ?   Vascular: No carotid bruit.  ?Cardiovascular:  ?   Rate and Rhythm: Normal rate and regular rhythm.  ?   Pulses: Normal pulses.  ?   Heart sounds: Normal heart sounds.  ?Pulmonary:  ?   Effort: Pulmonary effort is normal.  ?   Breath sounds: Normal breath sounds.  ?Abdominal:  ?   Palpations: Abdomen is soft.  ?Genitourinary: ?   Penis: Normal.   ?   Testes: Normal.  ?Musculoskeletal:  ?   Right lower leg: No edema.  ?   Left lower leg: No edema.  ?Lymphadenopathy:  ?   Cervical: No cervical adenopathy.  ?Skin: ?   General: Skin is warm and dry.  ?   Comments: Some atypical nevi on the back.  They are small.  ?Neurological:  ?   General: No focal deficit present.  ?   Mental Status: He is alert and oriented to person, place, and time.  ?Psychiatric:     ?   Mood and Affect: Mood normal.     ?   Behavior: Behavior normal.     ?   Thought Content: Thought content normal.     ?   Judgment: Judgment normal.  ?  ? ? ?Last depression screening scores ?PHQ 2/9 Scores 06/28/2021 06/27/2020 12/08/2019  ?PHQ - 2 Score 0 0 2  ?PHQ- 9 Score '4 3 7  '$ ? ?Last fall risk screening ?Fall Risk  06/28/2021  ?Falls in the past year? 0  ?Number falls in past yr: 0  ?Injury with Fall? 0  ?Risk for fall due to : No Fall Risks  ?Follow up Falls evaluation completed  ? ?Last Audit-C alcohol use screening ?Alcohol Use Disorder Test (AUDIT) 06/28/2021  ?1. How often do you have a drink containing alcohol? 1  ?2. How many drinks containing alcohol do you have on a typical day when you are drinking? 0  ?3. How often do you have six or more drinks on one occasion? 0  ?AUDIT-C Score 1  ?Alcohol Brief Interventions/Follow-up -  ? ?A score of 3 or more in women, and 4 or more in men indicates increased risk for alcohol abuse,  EXCEPT if all of the points are from question 1  ? ?  No results found for any visits on 06/28/21. ? Assessment & Plan  ?  ?Routine Health Maintenance and Physical Exam ? ?Exercise Activities and Dietary recommendations ? Goals   ?None ?  ? ? ?Immunization History  ?Administered Date(s) Administered  ? Influenza,inj,Quad PF,6+ Mos 01/09/2017, 12/31/2018  ? Tdap 12/31/2018  ? ? ?Health Maintenance  ?Topic Date Due  ? COVID-19 Vaccine (1) Never done  ? Hepatitis C Screening  Never done  ? INFLUENZA VACCINE  11/21/2020  ? COLONOSCOPY (Pts 45-76yr Insurance coverage will need to be confirmed)  12/31/2023  ? TETANUS/TDAP  12/30/2028  ? HIV Screening  Completed  ? HPV VACCINES  Aged Out  ? ? ?Discussed health benefits of physical activity, and encouraged him to engage in regular exercise appropriate for his age and condition. ? ?1. Annual physical exam ?Get home blood pressure cuff for use. ?- Lipid panel ?- CBC w/Diff/Platelet ?- Comprehensive Metabolic Panel (CMET) ? ?2. Pure hypertriglyceridemia ? ?- Lipid panel ?- CBC w/Diff/Platelet ?- Comprehensive Metabolic Panel (CMET) ? ?3. Screening for prostate cancer ? ?- PSA ? ?4. Screening for blood or protein in urine ? ?- POCT urinalysis dipstick-Negative ? ? ?Return in about 19 weeks (around 11/08/2021).  ?  ? ?I, RWilhemena Durie MD, have reviewed all documentation for this visit. The documentation on 07/01/21 for the exam, diagnosis, procedures, and orders are all accurate and complete. ? ? ? ?Anees Vanecek GCranford Mon MD  ?[facility]?[phone](phone) ?[phone](fax) ? ?[facility] ?

## 2021-06-28 ENCOUNTER — Other Ambulatory Visit: Payer: Self-pay

## 2021-06-28 ENCOUNTER — Ambulatory Visit (INDEPENDENT_AMBULATORY_CARE_PROVIDER_SITE_OTHER): Payer: 59 | Admitting: Family Medicine

## 2021-06-28 ENCOUNTER — Encounter: Payer: Self-pay | Admitting: Family Medicine

## 2021-06-28 VITALS — BP 147/82 | HR 77 | Temp 98.5°F | Resp 14 | Ht 69.0 in | Wt 195.0 lb

## 2021-06-28 DIAGNOSIS — Z1389 Encounter for screening for other disorder: Secondary | ICD-10-CM | POA: Diagnosis not present

## 2021-06-28 DIAGNOSIS — Z0000 Encounter for general adult medical examination without abnormal findings: Secondary | ICD-10-CM

## 2021-06-28 DIAGNOSIS — Z125 Encounter for screening for malignant neoplasm of prostate: Secondary | ICD-10-CM | POA: Diagnosis not present

## 2021-06-28 DIAGNOSIS — E781 Pure hyperglyceridemia: Secondary | ICD-10-CM | POA: Diagnosis not present

## 2021-06-28 LAB — POCT URINALYSIS DIPSTICK
Bilirubin, UA: NEGATIVE
Blood, UA: NEGATIVE
Glucose, UA: NEGATIVE
Ketones, UA: NEGATIVE
Leukocytes, UA: NEGATIVE
Nitrite, UA: NEGATIVE
Protein, UA: NEGATIVE
Spec Grav, UA: 1.01 (ref 1.010–1.025)
Urobilinogen, UA: 0.2 E.U./dL
pH, UA: 8 (ref 5.0–8.0)

## 2021-06-28 MED ORDER — AMLODIPINE BESYLATE 5 MG PO TABS: 5.0000 mg | ORAL_TABLET | Freq: Every day | ORAL | 3 refills | Status: DC

## 2021-06-28 NOTE — Patient Instructions (Signed)
Get [employer] or [employer] blood pressure cuff for home use and bring him home blood pressures checked once a week on next visit ?

## 2021-06-29 ENCOUNTER — Telehealth: Payer: Self-pay

## 2021-06-29 DIAGNOSIS — E781 Pure hyperglyceridemia: Secondary | ICD-10-CM

## 2021-06-29 LAB — COMPREHENSIVE METABOLIC PANEL
ALT: 17 IU/L (ref 0–44)
AST: 15 IU/L (ref 0–40)
Albumin/Globulin Ratio: 1.9 (ref 1.2–2.2)
Albumin: 4.9 g/dL (ref 4.0–5.0)
Alkaline Phosphatase: 54 IU/L (ref 44–121)
BUN/Creatinine Ratio: 10 (ref 9–20)
BUN: 12 mg/dL (ref 6–24)
Bilirubin Total: 0.9 mg/dL (ref 0.0–1.2)
CO2: 26 mmol/L (ref 20–29)
Calcium: 9.6 mg/dL (ref 8.7–10.2)
Chloride: 103 mmol/L (ref 96–106)
Creatinine, Ser: 1.23 mg/dL (ref 0.76–1.27)
Globulin, Total: 2.6 g/dL (ref 1.5–4.5)
Glucose: 102 mg/dL — ABNORMAL HIGH (ref 70–99)
Potassium: 5 mmol/L (ref 3.5–5.2)
Sodium: 141 mmol/L (ref 134–144)
Total Protein: 7.5 g/dL (ref 6.0–8.5)
eGFR: 72 mL/min/{1.73_m2} (ref >59)

## 2021-06-29 LAB — CBC WITH DIFFERENTIAL/PLATELET
Basophils Absolute: 0 10*3/uL (ref 0.0–0.2)
Basos: 1 %
EOS (ABSOLUTE): 0.1 10*3/uL (ref 0.0–0.4)
Eos: 1 %
Hematocrit: 44.7 % (ref 37.5–51.0)
Hemoglobin: 15.6 g/dL (ref 13.0–17.7)
Immature Grans (Abs): 0 10*3/uL (ref 0.0–0.1)
Immature Granulocytes: 0 %
Lymphocytes Absolute: 1.6 10*3/uL (ref 0.7–3.1)
Lymphs: 30 %
MCH: 30.6 pg (ref 26.6–33.0)
MCHC: 34.9 g/dL (ref 31.5–35.7)
MCV: 88 fL (ref 79–97)
Monocytes Absolute: 0.5 10*3/uL (ref 0.1–0.9)
Monocytes: 9 %
Neutrophils Absolute: 3.1 10*3/uL (ref 1.4–7.0)
Neutrophils: 59 %
Platelets: 218 10*3/uL (ref 150–450)
RBC: 5.1 x10E6/uL (ref 4.14–5.80)
RDW: 14 % (ref 11.6–15.4)
WBC: 5.3 10*3/uL (ref 3.4–10.8)

## 2021-06-29 LAB — LIPID PANEL
Chol/HDL Ratio: 5.9 ratio — ABNORMAL HIGH (ref 0.0–5.0)
Cholesterol, Total: 242 mg/dL — ABNORMAL HIGH (ref 100–199)
HDL: 41 mg/dL (ref >39)
LDL Chol Calc (NIH): 173 mg/dL — ABNORMAL HIGH (ref 0–99)
Triglycerides: 152 mg/dL — ABNORMAL HIGH (ref 0–149)
VLDL Cholesterol Cal: 28 mg/dL (ref 5–40)

## 2021-06-29 LAB — PSA: Prostate Specific Ag, Serum: 0.4 ng/mL (ref 0.0–4.0)

## 2021-06-29 MED ORDER — ROSUVASTATIN CALCIUM 5 MG PO TABS: 5.0000 mg | ORAL_TABLET | Freq: Every day | ORAL | 1 refills | Status: DC

## 2021-06-29 NOTE — Telephone Encounter (Signed)
Seen by patient Robert Guzman on 06/29/2021  3:56 PM ? ?Rx for Statin ordered.  ?

## 2021-08-20 ENCOUNTER — Other Ambulatory Visit: Payer: Self-pay | Admitting: Family Medicine

## 2021-08-20 DIAGNOSIS — E781 Pure hyperglyceridemia: Secondary | ICD-10-CM

## 2021-11-08 ENCOUNTER — Ambulatory Visit: Payer: 59 | Admitting: Family Medicine

## 2022-07-04 ENCOUNTER — Encounter: Payer: 59 | Admitting: Family Medicine

## 2022-07-30 ENCOUNTER — Ambulatory Visit: Payer: 59 | Admitting: Podiatry

## 2022-08-04 IMAGING — CT CT ANGIO CHEST
2 of 6 series · 19 of 46 positions shown · IV contrast (APPLIED)
Comparison: None.

CLINICAL DATA: Chest pain with pleurisy or effusion suspected.

EXAM:
CT ANGIOGRAPHY CHEST WITH CONTRAST
TECHNIQUE: Multidetector CT imaging of the chest was performed using the
standard protocol during bolus administration of intravenous
contrast. Multiplanar CT image reconstructions and MIPs were
obtained to evaluate the vascular anatomy.
CONTRAST:  75mL OMNIPAQUE IOHEXOL 350 MG/ML SOLN

[Series 5: thins · axial · 0.67mm/px · z∈[-525,-273]mm · 17 of 278 slices shown]
[im 13/278  lung]
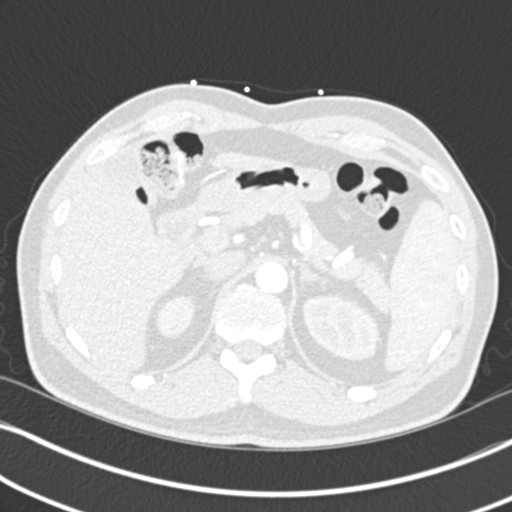
[im 25/278  soft-tissue]
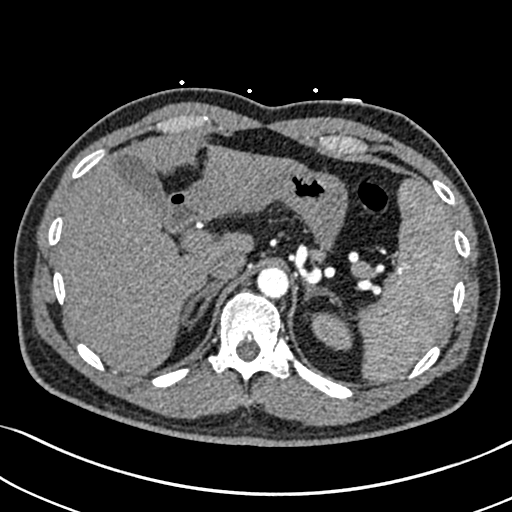
[im 49/278  lung]
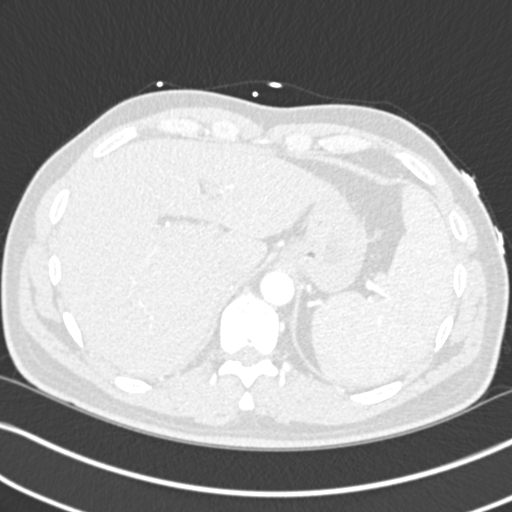
[im 61/278  soft-tissue]
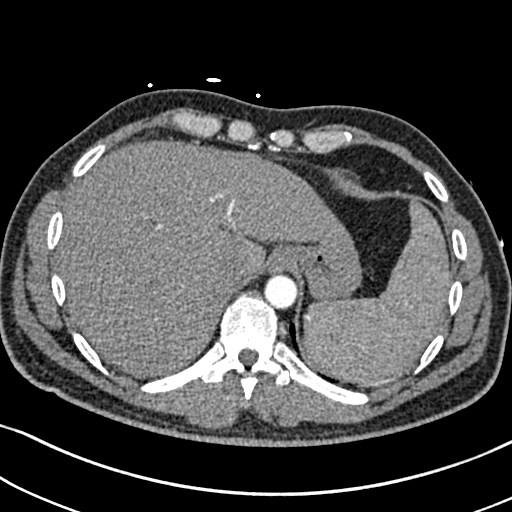
[im 73/278  lung]
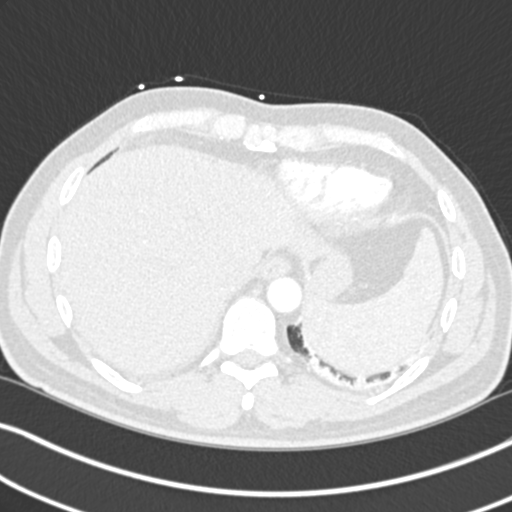
[im 97/278  soft-tissue]
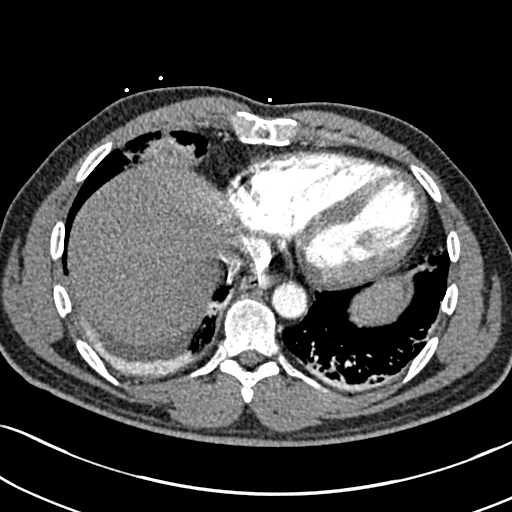
[im 109/278  lung]
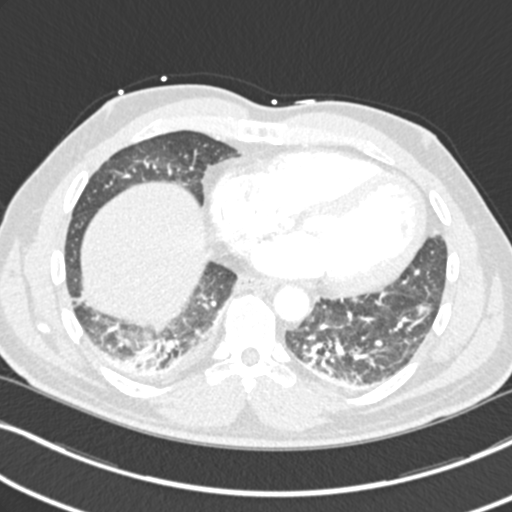
[im 121/278  soft-tissue]
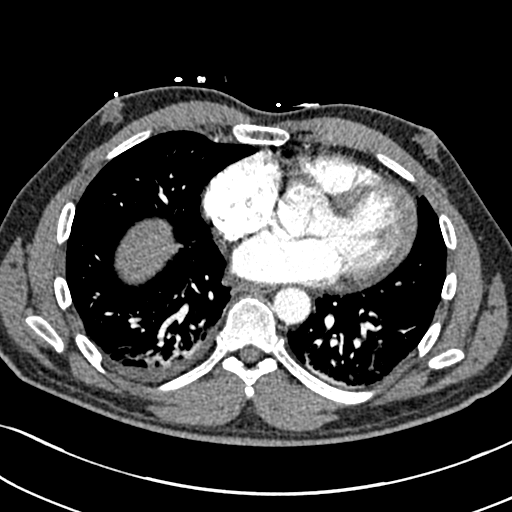
[im 145/278  lung]
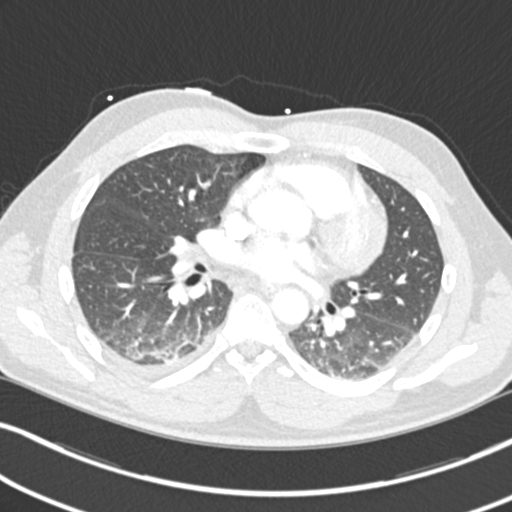
[im 157/278  soft-tissue]
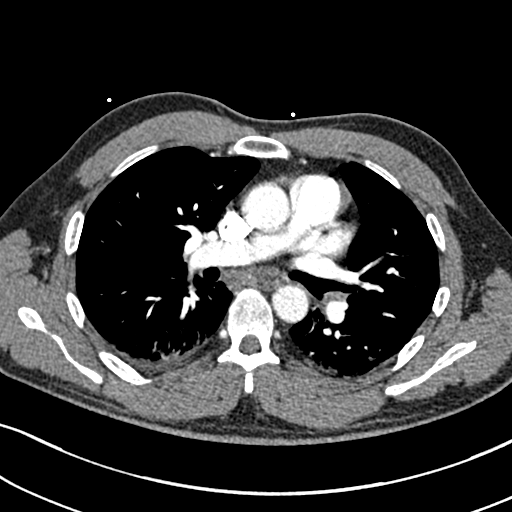
[im 169/278  lung]
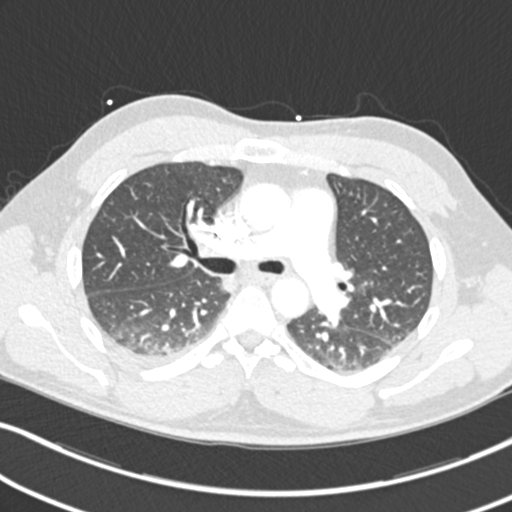
[im 181/278  soft-tissue]
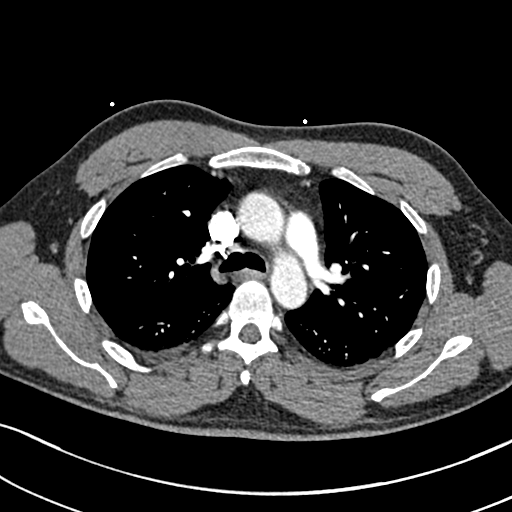
[im 205/278  lung]
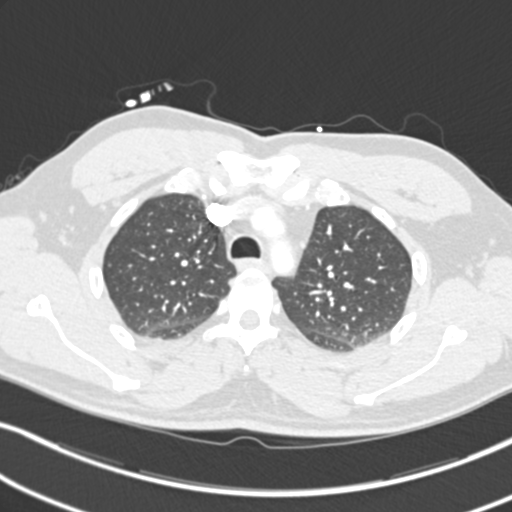
[im 217/278  soft-tissue]
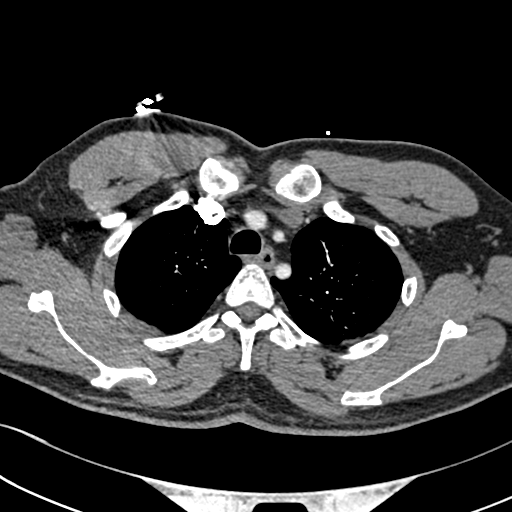
[im 229/278  lung]
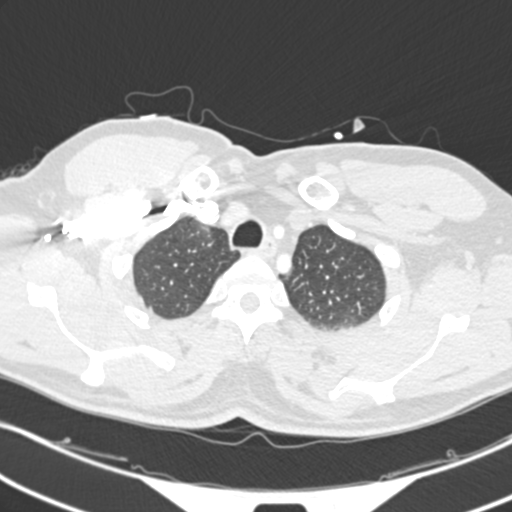
[im 253/278  soft-tissue]
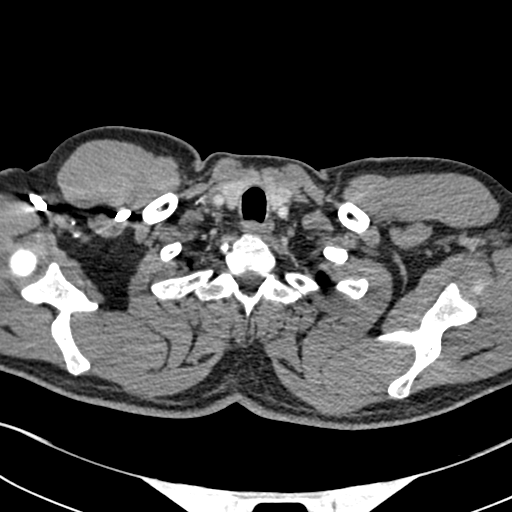
[im 265/278  lung]
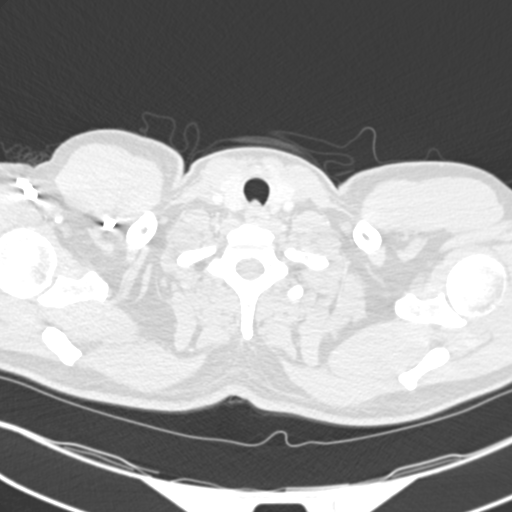

[Series 7: coronal mpr · coronal · 0.55mm/px · 2 of 73 slices shown]
[im 25/73  soft-tissue]
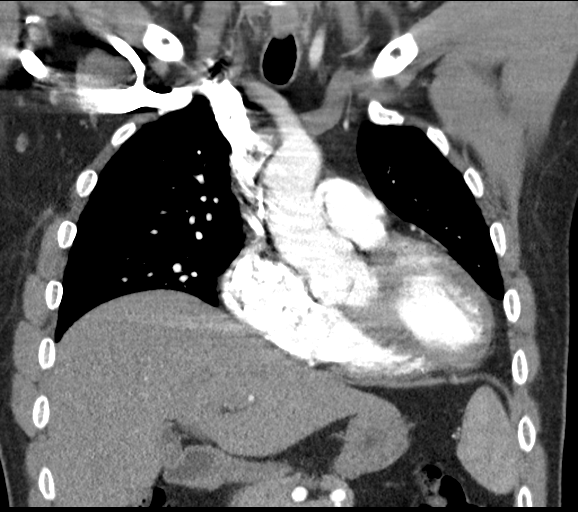
[im 49/73  soft-tissue]
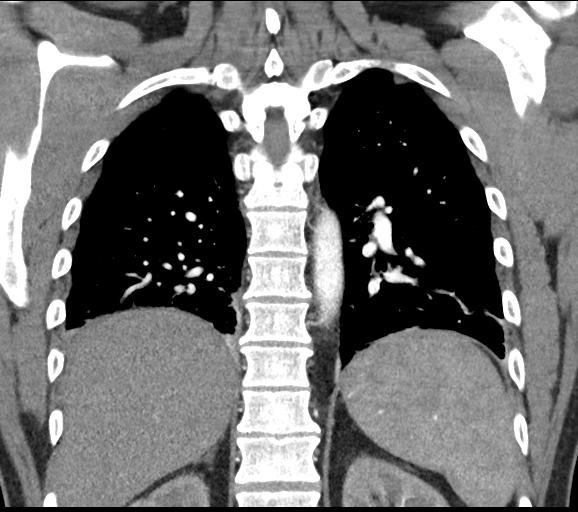

[19 of 46 positions shown; findings below may reference images not displayed]

FINDINGS: Cardiovascular: Normal heart size. No pericardial effusion. No
pulmonary artery filling defect. Normal appearance of the aorta.

Mediastinum/Nodes: Negative for adenopathy or mass.

Lungs/Pleura: Dependent atelectasis and trace pleural effusions. No
consolidation or pulmonary edema.

Upper Abdomen: Postoperative splenic flexure.

Musculoskeletal: No acute finding.

Review of the MIP images confirms the above findings.
IMPRESSION: 1. Trace pleural effusions with lower lobe atelectasis.
2. Negative for pulmonary embolism.

## 2022-08-15 ENCOUNTER — Ambulatory Visit: Payer: 59 | Admitting: Podiatry

## 2022-08-29 ENCOUNTER — Ambulatory Visit (INDEPENDENT_AMBULATORY_CARE_PROVIDER_SITE_OTHER): Payer: 59

## 2022-08-29 ENCOUNTER — Ambulatory Visit: Payer: 59 | Admitting: Podiatry

## 2022-08-29 DIAGNOSIS — M2041 Other hammer toe(s) (acquired), right foot: Secondary | ICD-10-CM | POA: Diagnosis not present

## 2022-08-29 DIAGNOSIS — M722 Plantar fascial fibromatosis: Secondary | ICD-10-CM

## 2022-08-29 DIAGNOSIS — M21621 Bunionette of right foot: Secondary | ICD-10-CM

## 2022-08-29 NOTE — Progress Notes (Signed)
  Subjective:  Patient ID: Robert Guzman, male    DOB: Jun 15, 1970,  MRN: [id number] HPI Chief Complaint  Patient presents with   Plantar Fasciitis    Left foot arch pain started a month ago, right foot forefoot 5th toe pain,     52 y.o. male presents with the above complaint.   ROS: Denies fever chills nausea vomit muscle aches pains calf pain back pain chest pain shortness of breath.  Past Medical History:  Diagnosis Date   Colon cancer (HCC) 2006   COLON/ chemo   Encounter for blood transfusion    for colon cancer   Genetic testing 2007   Negative    Past Surgical History:  Procedure Laterality Date   COLON SURGERY  Nov 2006   COLONOSCOPY  05-28-12   COLONOSCOPY WITH PROPOFOL N/A 07/13/2015   Procedure: COLONOSCOPY WITH PROPOFOL;  Surgeon: Earline Mayotte, MD;  Location: [facility] ENDOSCOPY;  Service: Endoscopy;  Laterality: N/A;   COLONOSCOPY WITH PROPOFOL N/A 12/30/2020   Procedure: COLONOSCOPY WITH PROPOFOL;  Surgeon: Wyline Mood, MD;  Location: [facility] ENDOSCOPY;  Service: Gastroenterology;  Laterality: N/A;   TOOTH EXTRACTION      Current Outpatient Medications:    rosuvastatin (CRESTOR) 5 MG tablet, TAKE 1 TABLET (5 MG TOTAL) BY MOUTH DAILY., Disp: 90 tablet, Rfl: 3  No Known Allergies Review of Systems Objective:  There were no vitals filed for this visit.  General: Well developed, nourished, in no acute distress, alert and oriented x3   Dermatological: Skin is warm, dry and supple bilateral. Nails x 10 are well maintained; remaining integument appears unremarkable at this time. There are no open sores, no preulcerative lesions, no rash or signs of infection present.  Vascular: Dorsalis Pedis artery and Posterior Tibial artery pedal pulses are 2/4 bilateral with immedate capillary fill time. Pedal hair growth present. No varicosities and no lower extremity edema present bilateral.   Neruologic: Grossly intact via light touch bilateral. Vibratory intact via tuning fork  bilateral. Protective threshold with Semmes Wienstein monofilament intact to all pedal sites bilateral. Patellar and Achilles deep tendon reflexes 2+ bilateral. No Babinski or clonus noted bilateral.   Musculoskeletal: No gross boney pedal deformities bilateral. No pain, crepitus, or limitation noted with foot and ankle range of motion bilateral. Muscular strength 5/5 in all groups tested bilateral.  Tailor's bunion deformity is notable on radiograph but asymptomatic on palpation of the right foot.  Minimal tenderness on palpation medial calcaneal tubercle of the left heel.  Gait: Unassisted, Nonantalgic.    Radiographs:  Radiographs taken today of the bilateral foot demonstrate osseously mature foot.  No significant osseous abnormalities other than mild tailor's bunion deformity right.  Some soft tissue swelling at the plantar fascia calcaneal insertion site but nothing that appears acute.  Assessment & Plan:   Assessment: Planter fasciitis left tailor's bunion deformity right  Plan: Discussed etiology pathology conservative versus surgical therapies.  At this point I feel orthotics are going to be his best alternative.  We will schedule him for casting.  This will be for Planter fasciitis.     Montee Tallman T. [place], [place]

## 2022-09-14 ENCOUNTER — Other Ambulatory Visit: Payer: 59

## 2022-09-21 ENCOUNTER — Other Ambulatory Visit: Payer: Self-pay | Admitting: Podiatry

## 2022-09-21 DIAGNOSIS — M722 Plantar fascial fibromatosis: Secondary | ICD-10-CM

## 2022-09-21 DIAGNOSIS — M2041 Other hammer toe(s) (acquired), right foot: Secondary | ICD-10-CM

## 2022-09-21 DIAGNOSIS — M21621 Bunionette of right foot: Secondary | ICD-10-CM

## 2023-01-14 ENCOUNTER — Ambulatory Visit: Payer: 59 | Admitting: Podiatry

## 2023-01-14 DIAGNOSIS — M722 Plantar fascial fibromatosis: Secondary | ICD-10-CM

## 2023-01-23 ENCOUNTER — Ambulatory Visit: Payer: 59 | Admitting: Podiatry

## 2023-01-28 ENCOUNTER — Ambulatory Visit: Payer: 59 | Admitting: Podiatry

## 2023-02-06 ENCOUNTER — Encounter: Payer: Self-pay | Admitting: Podiatry

## 2023-02-06 ENCOUNTER — Ambulatory Visit: Payer: 59 | Admitting: Podiatry

## 2023-02-06 DIAGNOSIS — M722 Plantar fascial fibromatosis: Secondary | ICD-10-CM

## 2023-02-06 NOTE — Progress Notes (Signed)
Robert Guzman presents today for follow-up of his plantar fasciitis he states it still hurts mostly after I run multiple days in a row.  I bought some good shoes and put some inserts in them and the inserts really made my foot hurt worse.  He says but with the good shoes seems to be doing a little bit better my right foot does not hurt at all my left is the primary problem.  He said this is the reason I never got the orthotics because of frayed they were going to hurt.  He says I just do not feel like it some plantar fasciitis of had that before and this resolved just feels like my arch is following.  Objective: Vital signs are stable alert oriented x 3 there is no erythema edema cellulitis drainage or odor he has some tenderness on deep palpation of the midfoot possibly associated with spring ligament injury.  However this very well may just be fatigue or a sprain.  Assessment: Deep sprain midfoot.  This is only reproducible with multiple days of heavy activity.  Plan: Should this continue to do this after a couple of months then I would highly recommend that he get this very upset we get an MRI of the rear foot.

## 2023-09-26 ENCOUNTER — Other Ambulatory Visit: Payer: Self-pay

## 2023-09-26 ENCOUNTER — Telehealth: Payer: Self-pay

## 2023-09-26 DIAGNOSIS — Z85038 Personal history of other malignant neoplasm of large intestine: Secondary | ICD-10-CM

## 2023-09-26 NOTE — Telephone Encounter (Signed)
 Gastroenterology Pre-Procedure Review  Request Date: 01/07/24 Requesting Physician: Dr. Ole Berkeley  PATIENT REVIEW QUESTIONS: The patient responded to the following health history questions as indicated:    1. Are you having any GI issues? no 2. Do you have a personal history of Polyps? Personal history of colon cancer last colonoscopy 12/30/2020 performed by Dr. Antony Baumgartner 3. Do you have a family history of Colon Cancer or Polyps? no 4. Diabetes Mellitus? no 5. Joint replacements in the past 12 months?no 6. Major health problems in the past 3 months?no 7. Any artificial heart valves, MVP, or defibrillator?no    MEDICATIONS & ALLERGIES:    Patient reports the following regarding taking any anticoagulation/antiplatelet therapy:   Plavix, Coumadin, Eliquis, Xarelto, Lovenox, Pradaxa, Brilinta, or Effient? no Aspirin? no  Patient confirms/reports the following medications:  Current Outpatient Medications  Medication Sig Dispense Refill   rosuvastatin  (CRESTOR ) 5 MG tablet TAKE 1 TABLET (5 MG TOTAL) BY MOUTH DAILY. 90 tablet 3   No current facility-administered medications for this visit.    Patient confirms/reports the following allergies:  No Known Allergies  No orders of the defined types were placed in this encounter.   AUTHORIZATION INFORMATION Primary Insurance: 1D#: Group #:  Secondary Insurance: 1D#: Group #:  SCHEDULE INFORMATION: Date: 01/07/24 Time: Location: [facility]

## 2023-12-30 MED ORDER — GOLYTELY 236 G PO SOLR
4000.0000 mL | Freq: Once | ORAL | 0 refills | Status: AC
Start: 2023-12-30 — End: 2023-12-30

## 2023-12-30 NOTE — Addendum Note (Signed)
 Addended by: CURTISS ROSALINE RAMAN on: 12/30/2023 02:55 PM   Modules accepted: Orders

## 2023-12-30 NOTE — Telephone Encounter (Signed)
 Prep requested from patient.  Suprep not preferred by insurance.  Prep sent to [facility] on [address] for [place] .  Instructions updated to reflect prep change to [place] . Pt advised of updated instructions in mychart.  Thanks,  [place], CMA

## 2024-01-07 ENCOUNTER — Ambulatory Visit: Admitting: Anesthesiology

## 2024-01-07 ENCOUNTER — Ambulatory Visit
Admission: RE | Admit: 2024-01-07 | Discharge: 2024-01-07 | Disposition: A | Discharge status: Home / Self Care | Attending: Gastroenterology | Admitting: Gastroenterology

## 2024-01-07 ENCOUNTER — Encounter: Payer: Self-pay | Admitting: Gastroenterology

## 2024-01-07 ENCOUNTER — Encounter
Admission: RE | Disposition: A | Discharge status: Home / Self Care | Payer: Self-pay | Source: Home / Self Care | Attending: Gastroenterology

## 2024-01-07 DIAGNOSIS — Z09 Encounter for follow-up examination after completed treatment for conditions other than malignant neoplasm: Secondary | ICD-10-CM | POA: Diagnosis present

## 2024-01-07 DIAGNOSIS — D124 Benign neoplasm of descending colon: Secondary | ICD-10-CM | POA: Insufficient documentation

## 2024-01-07 DIAGNOSIS — Z85038 Personal history of other malignant neoplasm of large intestine: Secondary | ICD-10-CM | POA: Insufficient documentation

## 2024-01-07 DIAGNOSIS — Z87891 Personal history of nicotine dependence: Secondary | ICD-10-CM | POA: Diagnosis not present

## 2024-01-07 DIAGNOSIS — K635 Polyp of colon: Secondary | ICD-10-CM | POA: Diagnosis not present

## 2024-01-07 DIAGNOSIS — Z1211 Encounter for screening for malignant neoplasm of colon: Secondary | ICD-10-CM | POA: Diagnosis present

## 2024-01-07 HISTORY — PX: COLONOSCOPY: SHX5424

## 2024-01-07 HISTORY — PX: POLYPECTOMY: SHX149

## 2024-01-07 SURGERY — COLONOSCOPY
Anesthesia: General

## 2024-01-07 MED ORDER — SODIUM CHLORIDE 0.9 % IV SOLN: INTRAVENOUS | Status: DC

## 2024-01-07 MED ORDER — PROPOFOL 1000 MG/100ML IV EMUL
INTRAVENOUS | Status: AC
  Filled 2024-01-07: qty 100

## 2024-01-07 MED ORDER — PROPOFOL 10 MG/ML IV BOLUS
INTRAVENOUS | Status: DC | PRN
  Administered 2024-01-07: 100 mg via INTRAVENOUS
  Administered 2024-01-07: 120 ug/kg/min via INTRAVENOUS

## 2024-01-07 NOTE — Anesthesia Postprocedure Evaluation (Signed)
 Anesthesia Post Note  Patient: Robert Guzman  Procedure(s) Performed: COLONOSCOPY POLYPECTOMY, INTESTINE  Patient location during evaluation: Endoscopy Anesthesia Type: General Level of consciousness: awake and alert Pain management: pain level controlled Vital Signs Assessment: post-procedure vital signs reviewed and stable Respiratory status: spontaneous breathing, nonlabored ventilation, respiratory function stable and patient connected to nasal cannula oxygen Cardiovascular status: blood pressure returned to baseline and stable Postop Assessment: no apparent nausea or vomiting Anesthetic complications: no   No notable events documented.   Last Vitals:  Vitals:   01/07/24 0851 01/07/24 0901  BP: 96/66 111/73  Pulse: (!) 55 (!) 52  Resp: 12 14  Temp: (!) 36.3 C   SpO2: 98% 99%    Last Pain:  Vitals:   01/07/24 0901  TempSrc:   PainSc: 0-No pain                 Robert Guzman

## 2024-01-07 NOTE — Anesthesia Preprocedure Evaluation (Signed)
 Anesthesia Evaluation  Patient identified by MRN, date of birth, ID band Patient awake    Reviewed: Allergy & Precautions, NPO status , Patient's Chart, lab work & pertinent test results  History of Anesthesia Complications Negative for: history of anesthetic complications  Airway Mallampati: III  TM Distance: >3 FB Neck ROM: full    Dental no notable dental hx.    Pulmonary neg pulmonary ROS, former smoker   Pulmonary exam normal        Cardiovascular hypertension, Normal cardiovascular exam     Neuro/Psych  PSYCHIATRIC DISORDERS Anxiety     negative neurological ROS     GI/Hepatic negative GI ROS, Neg liver ROS,,,  Endo/Other  negative endocrine ROS    Renal/GU negative Renal ROS  negative genitourinary   Musculoskeletal   Abdominal   Peds  Hematology negative hematology ROS (+)   Anesthesia Other Findings Past Medical History: 2006: Colon cancer (HCC)     Comment:  COLON/ chemo No date: Encounter for blood transfusion     Comment:  for colon cancer 2007: Genetic testing     Comment:  Negative   Past Surgical History: Nov 2006: COLON SURGERY 05-28-12: COLONOSCOPY 07/13/2015: COLONOSCOPY WITH PROPOFOL ; N/A     Comment:  Procedure: COLONOSCOPY WITH PROPOFOL ;  Surgeon: Reyes LELON Cota, MD;  Location: [facility] ENDOSCOPY;  Service:               Endoscopy;  Laterality: N/A; 12/30/2020: COLONOSCOPY WITH PROPOFOL ; N/A     Comment:  Procedure: COLONOSCOPY WITH PROPOFOL ;  Surgeon: Therisa Bi, MD;  Location: [facility] ENDOSCOPY;  Service:               Gastroenterology;  Laterality: N/A; No date: TOOTH EXTRACTION     Reproductive/Obstetrics negative OB ROS                              Anesthesia Physical Anesthesia Plan  ASA: 2  Anesthesia Plan: General   Post-op Pain Management: Minimal or no pain anticipated   Induction: Intravenous  PONV Risk Score and  Plan: 1 and Propofol  infusion and TIVA  Airway Management Planned: Natural Airway and Nasal Cannula  Additional Equipment:   Intra-op Plan:   Post-operative Plan:   Informed Consent: I have reviewed the patients History and Physical, chart, labs and discussed the procedure including the risks, benefits and alternatives for the proposed anesthesia with the patient or authorized representative who has indicated his/her understanding and acceptance.     Dental Advisory Given  Plan Discussed with: Anesthesiologist, CRNA and Surgeon  Anesthesia Plan Comments: (Patient consented for risks of anesthesia including but not limited to:  - adverse reactions to medications - risk of airway placement if required - damage to eyes, teeth, lips or other oral mucosa - nerve damage due to positioning  - sore throat or hoarseness - Damage to heart, brain, nerves, lungs, other parts of body or loss of life  Patient voiced understanding and assent.)         Anesthesia Quick Evaluation

## 2024-01-07 NOTE — Op Note (Signed)
 [facility] Gastroenterology Patient Name: Robert Guzman Procedure Date: 01/07/2024 8:29 AM MRN: [id number] Account #: [id number] Date of Birth: [birth year] Admit Type: Outpatient Age: 53 Room: [facility] ENDO ROOM 4 Gender: Male Note Status: Finalized Instrument Name: Colon Scope [id number] Procedure:             Colonoscopy Indications:           High risk colon cancer surveillance: Personal history                         of colonic polyps Providers:             Rogelia Copping MD, MD Referring MD:          Charlie CROME. Bertrum, MD (Referring MD) Medicines:             Propofol  per Anesthesia Complications:         No immediate complications. Procedure:             Pre-Anesthesia Assessment:                        - Prior to the procedure, a History and Physical was                         performed, and patient medications and allergies were                         reviewed. The patient's tolerance of previous                         anesthesia was also reviewed. The risks and benefits                         of the procedure and the sedation options and risks                         were discussed with the patient. All questions were                         answered, and informed consent was obtained. Prior                         Anticoagulants: The patient has taken no anticoagulant                         or antiplatelet agents. ASA Grade Assessment: II - A                         patient with mild systemic disease. After reviewing                         the risks and benefits, the patient was deemed in                         satisfactory condition to undergo the procedure.                        After obtaining informed consent, the colonoscope was  passed under direct vision. Throughout the procedure,                         the patient's blood pressure, pulse, and oxygen                         saturations were monitored continuously. The                          Colonoscope was introduced through the anus and                         advanced to the the ileocolonic anastomosis. The                         colonoscopy was performed without difficulty. The                         patient tolerated the procedure well. The quality of                         the bowel preparation was excellent. Findings:      The perianal and digital rectal examinations were normal.      A 4 mm polyp was found in the descending colon. The polyp was sessile.       The polyp was removed with a cold snare. Resection and retrieval were       complete.      A 3 mm polyp was found in the sigmoid colon. The polyp was sessile. The       polyp was removed with a cold snare. Resection and retrieval were       complete. Impression:            - One 4 mm polyp in the descending colon, removed with                         a cold snare. Resected and retrieved.                        - One 3 mm polyp in the sigmoid colon, removed with a                         cold snare. Resected and retrieved. Recommendation:        - Discharge patient to home.                        - Resume previous diet.                        - Continue present medications.                        - Await pathology results.                        - Repeat colonoscopy in 5 years for surveillance. Procedure Code(s):     --- Professional ---                        [id number], Colonoscopy, flexible;  with removal of                         tumor(s), polyp(s), or other lesion(s) by snare                         technique Diagnosis Code(s):     --- Professional ---                        Z86.010, Personal history of colonic polyps                        D12.5, Benign neoplasm of sigmoid colon CPT copyright 2022 American Medical Association. All rights reserved. The codes documented in this report are preliminary and upon coder review may  be revised to meet current compliance requirements. Rogelia Copping MD, MD 01/07/2024 8:48:14 AM This report has been signed electronically. Number of Addenda: 0 Note Initiated On: 01/07/2024 8:29 AM Scope Withdrawal Time: 0 hours 8 minutes 59 seconds  Total Procedure Duration: 0 hours 11 minutes 53 seconds  Estimated Blood Loss:  Estimated blood loss: none.      [facility]

## 2024-01-07 NOTE — H&P (Signed)
 Robert Copping, MD [facility] [address], [address] [place], [place] [address] [phone] Fax : [phone]  Primary Care Physician:  Robert Charlie CROME, MD Primary Gastroenterologist:  Dr. Copping  Pre-Procedure History & Physical: HPI:  Robert Guzman is a 53 y.o. male is here for an colonoscopy.   Past Medical History:  Diagnosis Date   Colon cancer (HCC) 2006   COLON/ chemo   Encounter for blood transfusion    for colon cancer   Genetic testing 2007   Negative     Past Surgical History:  Procedure Laterality Date   COLON SURGERY  Nov 2006   COLONOSCOPY  05-28-12   COLONOSCOPY WITH PROPOFOL  N/A 07/13/2015   Procedure: COLONOSCOPY WITH PROPOFOL ;  Surgeon: Robert LELON Cota, MD;  Location: [facility] ENDOSCOPY;  Service: Endoscopy;  Laterality: N/A;   COLONOSCOPY WITH PROPOFOL  N/A 12/30/2020   Procedure: COLONOSCOPY WITH PROPOFOL ;  Surgeon: Robert Bi, MD;  Location: [facility] ENDOSCOPY;  Service: Gastroenterology;  Laterality: N/A;   TOOTH EXTRACTION      Prior to Admission medications   Medication Sig Start Date End Date Taking? Authorizing Provider  rosuvastatin  (CRESTOR ) 5 MG tablet TAKE 1 TABLET (5 MG TOTAL) BY MOUTH DAILY. 08/21/21  Yes Robert Charlie CROME, MD    Allergies as of 09/27/2023   (No Known Allergies)    Family History  Problem Relation Age of Onset   Cancer Mother    Diabetes Father    Hypertension Father    Heart attack Father     Social History   Socioeconomic History   Marital status: Married    Spouse name: Not on file   Number of children: Not on file   Years of education: Not on file   Highest education level: Not on file  Occupational History   Not on file  Tobacco Use   Smoking status: Former   Smokeless tobacco: Never  Vaping Use   Vaping status: Never Used  Substance and Sexual Activity   Alcohol use: Yes    Alcohol/week: 1.0 standard drink of alcohol    Types: 1 Cans of beer per week   Drug use: No   Sexual activity: Yes  Other  Topics Concern   Not on file  Social History Narrative   Police officer   Married- 2 kids- 50 y.o. and 22 y.o.   Social Drivers of Corporate investment banker Strain: Low Risk  (06/27/2023)   Received from [facility]   Overall Financial Resource Strain (CARDIA)    Difficulty of Paying Living Expenses: Not hard at all  Food Insecurity: No Food Insecurity (06/27/2023)   Received from [facility]   Hunger Vital Sign    Within the past 12 months, you worried that your food would run out before you got the money to buy more.: Never true    Within the past 12 months, the food you bought just didn't last and you didn't have money to get more.: Never true  Transportation Needs: No Transportation Needs (06/27/2023)   Received from [facility] - Transportation    In the past 12 months, has lack of transportation kept you from medical appointments or from getting medications?: No    Lack of Transportation (Non-Medical): No  Physical Activity: Not on file  Stress: Not on file  Social Connections: Not on file  Intimate Partner Violence: Not on file    Review of Systems: See HPI, otherwise negative ROS  Physical Exam: BP 132/86   Pulse 65   Temp (!) 97.3 F (36.3 C) (Temporal)   Resp 17   Ht 5' 8 (1.727 m)   Wt 76.9 kg   SpO2 100%   BMI 25.79 kg/m  General:   Alert,  pleasant and cooperative in NAD Head:  Normocephalic and atraumatic. Neck:  Supple; no masses or thyromegaly. Lungs:  Clear throughout to auscultation.    Heart:  Regular rate and rhythm. Abdomen:  Soft, nontender and nondistended. Normal bowel sounds, without guarding, and without rebound.   Neurologic:  Alert and  oriented x4;  grossly normal neurologically.  Impression/Plan: Robert Guzman is here for an colonoscopy to be performed for a history of adenomatous polyps on 2022   Risks, benefits, limitations, and alternatives regarding  colonoscopy have  been reviewed with the patient.  Questions have been answered.  All parties agreeable.   Robert Copping, MD  01/07/2024, 8:26 AM

## 2024-01-07 NOTE — Transfer of Care (Signed)
 Immediate Anesthesia Transfer of Care Note  Patient: Robert Guzman  Procedure(s) Performed: COLONOSCOPY POLYPECTOMY, INTESTINE  Patient Location: PACU  Anesthesia Type:MAC  Level of Consciousness: sedated  Airway & Oxygen Therapy: Patient Spontanous Breathing  Post-op Assessment: Report given to RN  Post vital signs: Reviewed and stable  Last Vitals:  Vitals Value Taken Time  BP 96/66 01/07/24 08:51  Temp    Pulse 55 01/07/24 08:51  Resp 12 01/07/24 08:51  SpO2 98 % 01/07/24 08:51    Last Pain:  Vitals:   01/07/24 0750  TempSrc: Temporal  PainSc: 0-No pain         Complications: No notable events documented.

## 2024-01-08 ENCOUNTER — Ambulatory Visit: Payer: Self-pay | Admitting: Gastroenterology

## 2024-01-08 LAB — SURGICAL PATHOLOGY

## 2024-01-21 ENCOUNTER — Emergency Department

## 2024-01-21 ENCOUNTER — Emergency Department
Admission: EM | Admit: 2024-01-21 | Discharge: 2024-01-21 | Disposition: A | Discharge status: Home / Self Care | Attending: Emergency Medicine | Admitting: Emergency Medicine

## 2024-01-21 ENCOUNTER — Other Ambulatory Visit: Payer: Self-pay

## 2024-01-21 DIAGNOSIS — S0101XA Laceration without foreign body of scalp, initial encounter: Secondary | ICD-10-CM | POA: Diagnosis not present

## 2024-01-21 DIAGNOSIS — S0990XA Unspecified injury of head, initial encounter: Secondary | ICD-10-CM | POA: Diagnosis present

## 2024-01-21 DIAGNOSIS — Z23 Encounter for immunization: Secondary | ICD-10-CM | POA: Diagnosis not present

## 2024-01-21 DIAGNOSIS — W1809XA Striking against other object with subsequent fall, initial encounter: Secondary | ICD-10-CM | POA: Insufficient documentation

## 2024-01-21 DIAGNOSIS — R0789 Other chest pain: Secondary | ICD-10-CM | POA: Diagnosis not present

## 2024-01-21 MED ORDER — TETANUS-DIPHTH-ACELL PERTUSSIS 5-2.5-18.5 LF-MCG/0.5 IM SUSY
0.5000 mL | PREFILLED_SYRINGE | Freq: Once | INTRAMUSCULAR | Status: AC
  Administered 2024-01-21: 0.5 mL via INTRAMUSCULAR
  Filled 2024-01-21: qty 0.5

## 2024-01-21 NOTE — ED Triage Notes (Signed)
 Pt reports he hit his head on a beam when he was trying to close the door, pt has laceration to right side of head. Bleeding controlled.

## 2024-01-21 NOTE — ED Provider Notes (Signed)
 [facility] Provider Note    Event Date/Time   First MD Initiated Contact with Patient 01/21/24 [phone]     (approximate)   History   Laceration   HPI  Robert Guzman is a 53 y.o. male who is otherwise healthy not on any blood thinners who comes in with concerns for falling.  Patient reports a mechanical fall in which he then hit his right side of his head on a beam.  Patient has laceration to the head.  He is unsure of his last tetanus shot.  He denies any neck pain or any other injuries other than some mild right chest wall pain.  He denied any chest pain, shortness of breath prior to the fall.  Reports pain is a 2 out of 10  Physical Exam   Triage Vital Signs: ED Triage Vitals  Encounter Vitals Group     BP 01/21/24 0011 (!) 161/96     Girls Systolic BP Percentile --      Girls Diastolic BP Percentile --      Boys Systolic BP Percentile --      Boys Diastolic BP Percentile --      Pulse Rate 01/21/24 0011 67     Resp 01/21/24 0011 16     Temp 01/21/24 0011 98 F (36.7 C)     Temp src --      SpO2 01/21/24 0011 99 %     Weight 01/21/24 0010 167 lb (75.8 kg)     Height 01/21/24 0010 5' 8 (1.727 m)     Head Circumference --      Peak Flow --      Pain Score 01/21/24 0010 0     Pain Loc --      Pain Education --      Exclude from Growth Chart --     Most recent vital signs: Vitals:   01/21/24 0011  BP: (!) 161/96  Pulse: 67  Resp: 16  Temp: 98 F (36.7 C)  SpO2: 99%     General: Awake, no distress.  CV:  Good peripheral perfusion.  Resp:  Normal effort.  Abd:  No distention.  Other:  3 cm laceration to the right upper head.  No C-spine tenderness.  Full range of motion of neck.  No numbness, no tingling Mild right chest wall tenderness without any bruising. Clear breath sounds bilaterally Abdomen soft and nontender   ED Results / Procedures / Treatments   Labs (all labs ordered are listed, but only abnormal results are  displayed) Labs Reviewed - No data to display     RADIOLOGY I have reviewed the ct personally and interpreted no evidence of intracranial hemorrhage   PROCEDURES:  Critical Care performed: No  .Laceration Repair  Date/Time: 01/21/2024 3:45 AM  Performed by: Ernest Ronal FORBES, MD Authorized by: Ernest Ronal FORBES, MD   Consent:    Consent obtained:  Verbal   Consent given by:  Patient   Risks discussed:  Infection   Alternatives discussed:  No treatment Universal protocol:    Patient identity confirmed:  Verbally with patient Anesthesia:    Anesthesia method:  None Laceration details:    Location:  Scalp   Scalp location:  R temporal   Length (cm):  3 Treatment:    Area cleansed with:  Saline and povidone-iodine   Amount of cleaning:  Standard   Irrigation method:  Syringe   Visualized foreign bodies/material removed: no  Debridement:  None Skin repair:    Repair method:  Staples   Number of staples:  3 Approximation:    Approximation:  Close Repair type:    Repair type:  Simple Post-procedure details:    Dressing:  Open (no dressing)   Procedure completion:  Tolerated well, no immediate complications    MEDICATIONS ORDERED IN ED: Medications  Tdap (BOOSTRIX) injection 0.5 mL (has no administration in time range)     IMPRESSION / MDM / ASSESSMENT AND PLAN / ED COURSE  I reviewed the triage vital signs and the nursing notes.   Patient's presentation is most consistent with acute presentation with potential threat to life or bodily function.   Patient comes in with concerns for head injury after mechanical fall.  Denied any syncopal or presyncope symptoms.  CT imaging ordered evaluate for intercranial hemorrhage.  No indication for CT cervical given cleared by [facility]. X-ray of the chest ordered to make sure no pneumothorax, rib fracture Patient declined pain medication Tdap updated  Patient's laceration was washed out and although it was not very deep he did  have some active bleeding when cleaned out and was slightly open therefore after discussion with patient staples were placed.  This was tolerated well.     FINAL CLINICAL IMPRESSION(S) / ED DIAGNOSES   Final diagnoses:  Injury of head, initial encounter  Laceration of scalp without foreign body, initial encounter     Rx / DC Orders   ED Discharge Orders     None        Note:  This document was prepared using Dragon voice recognition software and may include unintentional dictation errors.   Ernest Ronal BRAVO, MD 01/21/24 [phone]

## 2024-01-21 NOTE — Discharge Instructions (Signed)
 Staples will need to be removed in 10 to 14 days Return to ER for fevers, worsening symptoms or any other concerns
